# Patient Record
Sex: Female | Born: 1990 | Race: White | Hispanic: Yes | Marital: Married | Smoking: Never smoker
Health system: Southern US, Community
[De-identification: ages and names within clinical notes are randomized; demographics above are authoritative.]

## PROBLEM LIST (undated history)

## (undated) DIAGNOSIS — E119 Type 2 diabetes mellitus without complications: Secondary | ICD-10-CM

## (undated) DIAGNOSIS — Z0000 Encounter for general adult medical examination without abnormal findings: Secondary | ICD-10-CM

## (undated) HISTORY — DX: Encounter for general adult medical examination without abnormal findings: Z00.00

## (undated) HISTORY — PX: CHOLECYSTECTOMY: SHX55

## (undated) HISTORY — PX: APPENDECTOMY: SHX54

---
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
## 2014-07-01 ENCOUNTER — Emergency Department (HOSPITAL_COMMUNITY)
Admission: EM | Admit: 2014-07-01 | Discharge: 2014-07-01 | Disposition: A | Discharge status: Home / Self Care | Payer: Self-pay | Attending: Emergency Medicine | Admitting: Emergency Medicine

## 2014-07-01 ENCOUNTER — Emergency Department (HOSPITAL_COMMUNITY): Payer: Self-pay

## 2014-07-01 ENCOUNTER — Encounter (HOSPITAL_COMMUNITY): Payer: Self-pay | Admitting: Emergency Medicine

## 2014-07-01 DIAGNOSIS — N946 Dysmenorrhea, unspecified: Secondary | ICD-10-CM | POA: Insufficient documentation

## 2014-07-01 DIAGNOSIS — N390 Urinary tract infection, site not specified: Secondary | ICD-10-CM | POA: Insufficient documentation

## 2014-07-01 DIAGNOSIS — Z3202 Encounter for pregnancy test, result negative: Secondary | ICD-10-CM | POA: Insufficient documentation

## 2014-07-01 LAB — URINE MICROSCOPIC-ADD ON

## 2014-07-01 LAB — URINALYSIS, ROUTINE W REFLEX MICROSCOPIC
Bilirubin Urine: NEGATIVE
Glucose, UA: NEGATIVE mg/dL
Ketones, ur: 15 mg/dL — AB
Nitrite: NEGATIVE
Protein, ur: 30 mg/dL — AB
Specific Gravity, Urine: 1.022 (ref 1.005–1.030)
Urobilinogen, UA: 0.2 mg/dL (ref 0.0–1.0)
pH: 6 (ref 5.0–8.0)

## 2014-07-01 LAB — I-STAT CHEM 8, ED
BUN: 7 mg/dL (ref 6–23)
Calcium, Ion: 1.19 mmol/L (ref 1.12–1.23)
Chloride: 106 mEq/L (ref 96–112)
Creatinine, Ser: 0.5 mg/dL (ref 0.50–1.10)
Glucose, Bld: 110 mg/dL — ABNORMAL HIGH (ref 70–99)
HCT: 40 % (ref 36.0–46.0)
Hemoglobin: 13.6 g/dL (ref 12.0–15.0)
Potassium: 3.3 mEq/L — ABNORMAL LOW (ref 3.7–5.3)
Sodium: 140 mEq/L (ref 137–147)
TCO2: 19 mmol/L (ref 0–100)

## 2014-07-01 LAB — PREGNANCY, URINE: Preg Test, Ur: NEGATIVE

## 2014-07-01 LAB — WET PREP, GENITAL
Clue Cells Wet Prep HPF POC: NONE SEEN
Trich, Wet Prep: NONE SEEN
Yeast Wet Prep HPF POC: NONE SEEN

## 2014-07-01 MED ORDER — ACETAMINOPHEN 500 MG PO TABS
1000.0000 mg | ORAL_TABLET | Freq: Once | ORAL | Status: AC
  Administered 2014-07-01: 1000 mg via ORAL
  Filled 2014-07-01: qty 2

## 2014-07-01 MED ORDER — NAPROXEN 250 MG PO TABS: 250.0000 mg | ORAL_TABLET | Freq: Two times a day (BID) | ORAL | Status: DC | PRN

## 2014-07-01 MED ORDER — KETOROLAC TROMETHAMINE 60 MG/2ML IM SOLN
60.0000 mg | Freq: Once | INTRAMUSCULAR | Status: DC
  Filled 2014-07-01: qty 2

## 2014-07-01 MED ORDER — CEPHALEXIN 500 MG PO CAPS: 500.0000 mg | ORAL_CAPSULE | Freq: Four times a day (QID) | ORAL | Status: DC

## 2014-07-01 NOTE — Discharge Instructions (Signed)
°Emergency Department Resource Guide °1) Find a Doctor and Pay Out of Pocket °Although you won't have to find out who is covered by your insurance plan, it is a good idea to ask around and get recommendations. You will then need to call the office and see if the doctor you have chosen will accept you as a new patient and what types of options they offer for patients who are self-pay. Some doctors offer discounts or will set up payment plans for their patients who do not have insurance, but you will need to ask so you aren't surprised when you get to your appointment. ° °2) Contact Your Local Health Department °Not all health departments have doctors that can see patients for sick visits, but many do, so it is worth a call to see if yours does. If you don't know where your local health department is, you can check in your phone book. The CDC also has a tool to help you locate your state's health department, and many state websites also have listings of all of their local health departments. ° °3) Find a Walk-in Clinic °If your illness is not likely to be very severe or complicated, you may want to try a walk in clinic. These are popping up all over the country in pharmacies, drugstores, and shopping centers. They're usually staffed by nurse practitioners or physician assistants that have been trained to treat common illnesses and complaints. They're usually fairly quick and inexpensive. However, if you have serious medical issues or chronic medical problems, these are probably not your best option. ° °No Primary Care Doctor: °- Call [facility] at  832-8000 - they can help you locate a primary care doctor that  accepts your insurance, provides certain services, etc. °- Physician Referral Service- 1-800-533-3463 ° °Chronic Pain Problems: °Organization         Address  Phone   Notes  °[facility]  (336) [phone] Patients need to be referred by their primary care doctor.  ° °Medication  Assistance: °Organization         Address  Phone   Notes  °[place] [facility] [address] °[place], [place] [address] (336) 641-8030 --Must be a resident of [place] °-- Must have NO insurance coverage whatsoever (no Medicaid/ Medicare, etc.) °-- The pt. MUST have a primary care doctor that directs their care regularly and follows them in the community °  °[facility]  (866) 331-1348   °[employer]  (888) 892-1162   ° °Agencies that provide inexpensive medical care: °Organization         Address  Phone   Notes  °[facility]  (336) 832-8035   °[facility]    (336) 832-7272   °[facility] [address] °[place], [place] [address] (336) 832-4777   °[facility] [address], °[place] (336) 271-4999   °[facility]    (336) 373-0678   °[facility]    (336) 272-1050   °[facility] ° [address], [place] Phone:  (336) 832-4444, Fax:  (336) 832-4440 Hours of Operation:  9 am - 6 pm, M-F.  Also accepts Medicaid/Medicare and self-pay.  °[facility] ° [address], [place] Phone: (336) [phone], Fax: (336) [phone]. Hours of Operation:  8:30 am - 5:30 pm, M-F.  Also accepts Medicaid and self-pay.  °[facility] [place] [address], [place] Phone: (336) 878-6027   °[facility] [address], [place], [place] (336)723-1848, Ext. 123 Mondays & Thursdays: 7-9 AM.  First 15 patients are seen on a first come, first serve basis. °  ° °Medicaid-accepting [place] Providers: ° °Organization         Address  Phone   Notes  °[facility] [address], [place] (336) 641-2100 Also accepts self-pay patients.  °[facility] [address], [place] ° (336) 856-9996   °[facility] [address], [place]  (336) 288-8857   °[facility] [address] [place] [address], [place] (336) 299-7000   °Veita Bland [address], [place]  ° (336) 373-1557 Only accepts [place] Access Medicaid patients after they have their name applied to their card.  ° °Self-Pay (no insurance) in [place]: ° °Organization         Address  Phone   Notes  °Sickle Cell Patients, [facility] [address], [place] (336) 832-1970   °[facility] [address], [place] (336) 832-4400   °[facility] ° [address], [place] (336) 992-4800   °[facility]/Dr. Osei-Bonsu ° [address] [place] [address], [place] or [address], [place] (336) [phone] Phone number for both [place] and [place] locations is the same.  °[facility] [address], [place] (336) 299-0000   °[facility] [address] [place] [address], [place] or [address] (336) 852-7530 °(336) 878-2260   °[facility] [address], [place] (336) 350-1642, phone; (336) 294-5005, fax Sees patients 1st and 3rd Saturday of every month.  Must not qualify for public or private insurance (i.e. Medicaid, Medicare, [place] Health Choice, Veterans' Benefits) • Household income should be no more than 200% of the poverty level •The clinic cannot treat you if you are pregnant or think you are pregnant • Sexually transmitted diseases are not treated at the clinic.  ° ° °Dental Care: °Organization         Address  Phone  Notes  °[place] [facility] [address], [place] (336) 641-6152 Accepts children up to age 21 who are enrolled in Medicaid or [place] Health Choice; pregnant women with a Medicaid card; and children who have applied for Medicaid or [place] Health Choice, but were declined, whose parents can pay a reduced fee at time of service.  °[place]  [facility] [place]  [address], [place] (336) 641-7733 Accepts children up to age 21 who are enrolled in Medicaid or [place] Health Choice; pregnant women with a Medicaid card; and children who have applied for Medicaid or [place] Health Choice, but were declined, whose parents can pay a reduced fee at time of service.  °[facility] ° [address], [place] (336) 641-4533 Patients are seen by appointment only. Walk-ins are not accepted. [facility] will see patients 18 years of age and older. °Monday - Tuesday (8am-5pm) °Most Wednesdays (8:30-5pm) °$30 per visit, cash only  °[facility] ° [address], [place] (336) 641-4533 Patients are seen by appointment only. Walk-ins are not accepted. [facility] will see patients 18 years of age and older. °One   Wednesday Evening (Monthly: Volunteer Based).  $30 per visit, cash only  °[facility]  (919) [phone] for adults; Children under age 4, call Graduate Pediatric Dentistry at ([phone]) [phone]. Children aged 4-14, please call ([phone]) [phone] to request a pediatric application. ° Dental services are provided in all areas of dental care including fillings, crowns and bridges, complete and partial dentures, implants, gum treatment, root canals, and extractions. Preventive care is also provided. Treatment is provided to both adults and children. °Patients are selected via a lottery and there is often a waiting list. °  °[facility] [address], °[place] ° (336) 763-8833 [email or web address] °  °[facility] [address], [place], [place] (336)723-1848, Ext. 123 Second and Fourth Thursday of each month, opens at 6:30 AM; Clinic ends at 9 AM.  Patients are seen on a first-come first-served basis, and a limited number are seen during each clinic.  ° °[facility] ° [address], [place], [place] (336) 723-7904    Eligibility Requirements °You must have lived in [place], [place], or [place] counties for at least the last three months. °  You cannot be eligible for state or federal sponsored healthcare insurance, including Veterans Administration, Medicaid, or Medicare. °  You generally cannot be eligible for healthcare insurance through your employer.  °  How to apply: °Eligibility screenings are held every Tuesday and Wednesday afternoon from 1:00 pm until 4:00 pm. You do not need an appointment for the interview!  °[facility] [address], [place], [place] 336-631-2330   °[facility]  336-342-8273   °[facility]  336-703-3100   °[facility]  336-570-6415   ° °Behavioral Health Resources in the Community: °Intensive Outpatient Programs °Organization         Address  Phone  Notes  °[facility] [address], [place], [place] 336-878-6098   °[facility] [address], [place], [place] 336-832-9800   °[facility] [address], [place], [place] ° 336-882-2125   °[facility] [address],  °[place], [place] 1-800-853-5163 or 336-641-4981   °Substance Abuse Resources °Organization         Address  Phone  Notes  °[facility]  336-882-2125   °[facility]  336-784-9470   °[facility]  336-285-9073   °[facility]  336-845-3988   °Residential & Outpatient Substance Abuse Program  1-800-659-3381   °Psychological Services °Organization         Address  Phone  Notes  °[facility]  336- 832-9600   °[facility]  336- 378-7881   °[facility] [address], [place] 1-800-853-5163 or 336-641-4981   ° °Mobile Crisis Teams °Organization         Address  Phone  Notes  °[facility], Mobile Crisis Care Unit  1-877-626-1772   °Assertive °[facility] ° [address]  [place], [place] 336-834-9664   °Sharon DeEsch [address] °[place] 336-554-5454   ° °Self-Help/Support Groups °Organization         Address  Phone             Notes  °[facility] of  - variety of support groups  336- 373-1402 Call for more information  °Narcotics Anonymous (NA), [facility] [address], °[place]  2 meetings at this location  ° °  Residential Treatment Programs Organization         Address  Phone  Notes  [facility] [address],    [place]  [phone]   [facility]  [address], [place] [phone], [place], [place] [phone]   [facility] [address], [place] [phone] Admissions: 8am-3pm M-F  [facility] [address]    [place], [place] [phone]   [facility] [address], [place], [place] [phone]) [phone]   [facility] [address]  [place], [place] [phone]   [facility] Intensive Outpatient [address], [place], [place] [phone]   [facility] ([facility] [address]  [place], [place] [phone] or [phone]   [facility] ([facility]) [address] [place], [place] [phone] Accepts Medicaid  [facility] [address]  [place] [phone] Substance Abuse/Addiction Treatment   [facility] Organization         Address  Phone  Notes  [facility]  [phone](888) (740)[phone]   Angie FavaJulie Brannon, PhD [address], [address] [place], [place]   [phone](336) [phone] or [phone](336) ([phone]   [facility]   [address] [place], [place] [phone](336) [phone]   [facility] [address], [place], [place] [phone](336) [phone] Insurance/Medicaid/sponsorship through [facility]  [facility] [address]                                    [place], [place] [phone]) [phone] Therapy/tele-psych/case    [facility] [address]   [place], [place] [phone]) [phone]    Dr. Lolly MustacheArfeen  [phone]) [phone]   [facility]  [employer] [facility] 1) [address], [place] 2) [address], [place] 3)  [address], [place] [phone](336) [phone](336) [phone]  [phone](336) [phone]   [facility] Child Abuse Hotline [phone](336) 9251494717 or [phone](336) [phone] (After Hours)      Take the prescriptions as directed.  Apply moist heat or ice to the area(s) of discomfort, for 15 minutes at a time, several times per day for the next few days.  Do not fall asleep on a heating or ice pack.  Call your regular medical or OB/GYN doctor today to schedule a follow up appointment within the next week.  Return to the Emergency Department immediately if worsening.

## 2014-07-01 NOTE — ED Notes (Signed)
Per pt sts that she has been having abdominal cramping and diarrhea over the past few days. sts she is on her period.

## 2014-07-01 NOTE — ED Provider Notes (Signed)
CSN: [id number]     Arrival date & time 07/01/14  1027 History   First MD Initiated Contact with Patient 07/01/14 1043     Chief Complaint  Patient presents with  . Pelvic Pain      HPI Pt was seen at 1045.  Per pt, c/o gradual onset and persistence of constant lower pelvic "pain" for the past 3 to 4 days.  Has been associated with multiple intermittent episodes of loose stools.  Describes the pelvic pain as "cramping."  Denies N/V, no diarrhea, no fevers, no back pain, no rash, no CP/SOB, no black or blood in stools.       History reviewed. No pertinent past medical history.  Past Surgical History  Procedure Laterality Date  . Appendectomy    . Cholecystectomy      History  Substance Use Topics  . Smoking status: Never Smoker   . Smokeless tobacco: Not on file  . Alcohol Use: No    Review of Systems ROS: Statement: All systems negative except as marked or noted in the HPI; Constitutional: Negative for fever and chills. ; ; Eyes: Negative for eye pain, redness and discharge. ; ; ENMT: Negative for ear pain, hoarseness, nasal congestion, sinus pressure and sore throat. ; ; Cardiovascular: Negative for chest pain, palpitations, diaphoresis, dyspnea and peripheral edema. ; ; Respiratory: Negative for cough, wheezing and stridor. ; ; Gastrointestinal: Negative for nausea, vomiting, diarrhea, abdominal pain, blood in stool, hematemesis, jaundice and rectal bleeding. . ; ; Genitourinary: Negative for dysuria, flank pain and hematuria. ; ; GYN:  +pelvic pain, +vaginal bleeding, no vaginal discharge, no vulvar pain.;;  Musculoskeletal: Negative for back pain and neck pain. Negative for swelling and trauma.; ; Skin: Negative for pruritus, rash, abrasions, blisters, bruising and skin lesion.; ; Neuro: Negative for headache, lightheadedness and neck stiffness. Negative for weakness, altered level of consciousness , altered mental status, extremity weakness, paresthesias, involuntary movement,  seizure and syncope.      Allergies  Review of patient's allergies indicates no known allergies.  Home Medications   Prior to Admission medications   Not on File   BP 121/83  Pulse 120  Temp(Src) 98.2 F (36.8 C) (Oral)  Resp 18  Ht 5\' 3"  (1.6 m)  Wt 142 lb (64.411 kg)  BMI 25.16 kg/m2  SpO2 99%  LMP 06/29/2014 Physical Exam 1050: Physical examination:  Nursing notes reviewed; Vital signs and O2 SAT reviewed;  Constitutional: Well developed, Well nourished, Well hydrated, In no acute distress; Head:  Normocephalic, atraumatic; Eyes: EOMI, PERRL, No scleral icterus; ENMT: Mouth and pharynx normal, Mucous membranes moist; Neck: Supple, Full range of motion, No lymphadenopathy; Cardiovascular: Regular rate and rhythm, No murmur, rub, or gallop; Respiratory: Breath sounds clear & equal bilaterally, No rales, rhonchi, wheezes.  Speaking full sentences with ease, Normal respiratory effort/excursion; Chest: Nontender, Movement normal; Abdomen: Soft, +very mild suprapubic tenderness to palp. No rebound or guarding. Nondistended, Normal bowel sounds.; Genitourinary: No CVA tenderness. Pelvic exam performed with permission of pt and female ED tech assist during exam.  External genitalia w/o lesions. Vaginal vault without discharge, scant amount of dark blood in vault.  Cervix w/o lesions, not friable, GC/chlam and wet prep obtained and sent to lab.  Bimanual exam w/o CMT, uterine or adnexal tenderness.;; Extremities: Pulses normal, No tenderness, No edema, No calf edema or asymmetry.; Neuro: AA&Ox3, Major CN grossly intact.  Speech clear. No gross focal motor or sensory deficits in extremities. Climbs on and off stretcher easily  by herself. Gait steady.; Skin: Color normal, Warm, Dry.   ED Course  Procedures     MDM  MDM Reviewed: previous chart, nursing note and vitals Reviewed previous: labs Interpretation: labs and ultrasound     Results for orders placed during the hospital  encounter of 07/01/14  WET PREP, GENITAL      Result Value Ref Range   Yeast Wet Prep HPF POC NONE SEEN  NONE SEEN   Trich, Wet Prep NONE SEEN  NONE SEEN   Clue Cells Wet Prep HPF POC NONE SEEN  NONE SEEN   WBC, Wet Prep HPF POC FEW (*) NONE SEEN  URINALYSIS, ROUTINE W REFLEX MICROSCOPIC      Result Value Ref Range   Color, Urine YELLOW  YELLOW   APPearance HAZY (*) CLEAR   Specific Gravity, Urine 1.022  1.005 - 1.030   pH 6.0  5.0 - 8.0   Glucose, UA NEGATIVE  NEGATIVE mg/dL   Hgb urine dipstick LARGE (*) NEGATIVE   Bilirubin Urine NEGATIVE  NEGATIVE   Ketones, ur 15 (*) NEGATIVE mg/dL   Protein, ur 30 (*) NEGATIVE mg/dL   Urobilinogen, UA 0.2  0.0 - 1.0 mg/dL   Nitrite NEGATIVE  NEGATIVE   Leukocytes, UA SMALL (*) NEGATIVE  PREGNANCY, URINE      Result Value Ref Range   Preg Test, Ur NEGATIVE  NEGATIVE  URINE MICROSCOPIC-ADD ON      Result Value Ref Range   Squamous Epithelial / LPF MANY (*) RARE   WBC, UA 7-10  <3 WBC/hpf   RBC / HPF 7-10  <3 RBC/hpf   Bacteria, UA MANY (*) RARE   Urine-Other MUCOUS PRESENT    I-STAT CHEM 8, ED      Result Value Ref Range   Sodium 140  137 - 147 mEq/L   Potassium 3.3 (*) 3.7 - 5.3 mEq/L   Chloride 106  96 - 112 mEq/L   BUN 7  6 - 23 mg/dL   Creatinine, Ser 1.61  0.50 - 1.10 mg/dL   Glucose, Bld 096 (*) 70 - 99 mg/dL   Calcium, Ion 0.45  4.09 - 1.23 mmol/L   TCO2 19  0 - 100 mmol/L   Hemoglobin 13.6  12.0 - 15.0 g/dL   HCT 81.1  91.4 - 78.2 %   US Pelvis Complete 07/01/2014   CLINICAL DATA:  Pelvic pain suspect ovarian torsion  EXAM: TRANSABDOMINAL AND TRANSVAGINAL ULTRASOUND OF PELVIS  DOPPLER ULTRASOUND OF OVARIES  TECHNIQUE: Both transabdominal and transvaginal ultrasound examinations of the pelvis were performed. Transabdominal technique was performed for global imaging of the pelvis including uterus, ovaries, adnexal regions, and pelvic cul-de-sac.  It was necessary to proceed with endovaginal exam following the transabdominal exam  to visualize the endometrium and adnexal structures. Color and duplex Doppler ultrasound was utilized to evaluate blood flow to the ovaries.  COMPARISON:  None.  FINDINGS: Uterus  Measurements: 5.9 x 3.3 x 5.0 cm. No fibroids or other mass visualized.  Endometrium  Thickness: 4.5 mm.  No focal abnormality visualized.  Right ovary  Measurements: 3.3 x 1.8 x 1.5 cm. Normal appearance/no adnexal mass.  Left ovary  Measurements: 2.9 x 1.5 x 2.0 cm. Normal appearance/no adnexal mass.  Pulsed Doppler evaluation of both ovaries demonstrates normal low-resistance arterial and venous waveforms.  Other findings  There is a small amount of free pelvic fluid.  IMPRESSION: 1. The vascularity of the ovaries is normal. 2. The architecture of the uterus and adnexal  structures is normal.   Electronically Signed   By: David  [place]   On: 07/01/2014 12:31    1255:  Workup reassuring. Will tx for UTI. Potassium repleted PO. Pt tol PO well while in the ED without N/V. No stooling while in the ED. Pt feels better after meds and wants to go home now. Dx and testing d/w pt and family.  Questions answered.  Verb understanding, agreeable to d/c home with outpt f/u.   Laray AngerKathleen M Bond Grieshop, DO 07/04/14 [phone]

## 2014-07-02 LAB — GC/CHLAMYDIA PROBE AMP
CT Probe RNA: NEGATIVE
GC Probe RNA: NEGATIVE

## 2019-06-01 ENCOUNTER — Other Ambulatory Visit: Payer: Self-pay

## 2019-06-01 ENCOUNTER — Emergency Department (HOSPITAL_COMMUNITY): Payer: Self-pay

## 2019-06-01 ENCOUNTER — Emergency Department (HOSPITAL_COMMUNITY)
Admission: EM | Admit: 2019-06-01 | Discharge: 2019-06-01 | Disposition: A | Discharge status: Home / Self Care | Payer: Self-pay | Attending: Emergency Medicine | Admitting: Emergency Medicine

## 2019-06-01 ENCOUNTER — Encounter (HOSPITAL_COMMUNITY): Payer: Self-pay | Admitting: Emergency Medicine

## 2019-06-01 DIAGNOSIS — Z79899 Other long term (current) drug therapy: Secondary | ICD-10-CM | POA: Insufficient documentation

## 2019-06-01 DIAGNOSIS — R0789 Other chest pain: Secondary | ICD-10-CM | POA: Insufficient documentation

## 2019-06-01 DIAGNOSIS — R079 Chest pain, unspecified: Secondary | ICD-10-CM

## 2019-06-01 DIAGNOSIS — R202 Paresthesia of skin: Secondary | ICD-10-CM | POA: Insufficient documentation

## 2019-06-01 LAB — BASIC METABOLIC PANEL
Anion gap: 11 (ref 5–15)
BUN: 11 mg/dL (ref 6–20)
CO2: 22 mmol/L (ref 22–32)
Calcium: 9.1 mg/dL (ref 8.9–10.3)
Chloride: 105 mmol/L (ref 98–111)
Creatinine, Ser: 0.62 mg/dL (ref 0.44–1.00)
GFR calc Af Amer: >60 mL/min (ref >60)
GFR calc non Af Amer: >60 mL/min (ref >60)
Glucose, Bld: 109 mg/dL — ABNORMAL HIGH (ref 70–99)
Potassium: 3 mmol/L — ABNORMAL LOW (ref 3.5–5.1)
Sodium: 138 mmol/L (ref 135–145)

## 2019-06-01 LAB — I-STAT BETA HCG BLOOD, ED (MC, WL, AP ONLY): I-stat hCG, quantitative: <5 m[IU]/mL (ref <5)

## 2019-06-01 LAB — CBC
HCT: 39.6 % (ref 36.0–46.0)
Hemoglobin: 13.9 g/dL (ref 12.0–15.0)
MCH: 31 pg (ref 26.0–34.0)
MCHC: 35.1 g/dL (ref 30.0–36.0)
MCV: 88.4 fL (ref 80.0–100.0)
Platelets: 244 10*3/uL (ref 150–400)
RBC: 4.48 MIL/uL (ref 3.87–5.11)
RDW: 12.5 % (ref 11.5–15.5)
WBC: 8.4 10*3/uL (ref 4.0–10.5)
nRBC: 0 % (ref 0.0–0.2)

## 2019-06-01 LAB — TROPONIN I: Troponin I: <0.03 ng/mL (ref <0.03)

## 2019-06-01 LAB — D-DIMER, QUANTITATIVE: D-Dimer, Quant: <0.27 ug/mL-FEU (ref 0.00–0.50)

## 2019-06-01 MED ORDER — SODIUM CHLORIDE 0.9% FLUSH: 3.0000 mL | Freq: Once | INTRAVENOUS | Status: DC

## 2019-06-01 MED ORDER — SODIUM CHLORIDE 0.9 % IV BOLUS
1000.0000 mL | Freq: Once | INTRAVENOUS | Status: AC
  Administered 2019-06-01: 1000 mL via INTRAVENOUS

## 2019-06-01 NOTE — ED Triage Notes (Signed)
Patient reports left sided chest pressure that radiates into the left arm and back.  Describes arm as tingling.  Reports working for the first time in years and has been having shoulder pain prior to the start of the chest pain.

## 2019-06-01 NOTE — ED Provider Notes (Signed)
ED ECG REPORT   Date: 06/01/2019 0029am  Rate: 131  Rhythm: sinus tachycardia  QRS Axis: normal  Intervals: normal  ST/T Wave abnormalities: nonspecific ST changes  Conduction Disutrbances:none  Narrative Interpretation:   Old EKG Reviewed: none available  I have personally reviewed the EKG tracing and agree with the computerized printout as noted.    Ripley Fraise, MD 06/01/19 [phone]

## 2019-06-01 NOTE — ED Provider Notes (Signed)
[facility] EMERGENCY DEPARTMENT Provider Note   CSN: [id number] Arrival date & time: 06/01/19  0024    History   Chief Complaint Chief Complaint  Patient presents with  . Chest Pain    HPI Christy Cook is a 28 y.o. female with no significant past medical history who presents today for evaluation of left-sided chest pressure that radiates into her left arm and back.  She reports that she has had this pain since Wednesday (17th).  She also has tingling in her left arm.  She denies any nausea, or vomiting.  She is not been coughing or short of breath.  She denies any fevers or known coronavirus contacts.  She denies any trauma.  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  She tried a dose of ibuprofen yesterday which provided moderate temporary relief.  When her pain happens it lasts approximately 5 seconds then goes away.  She has a Nexplanon birth control implant in her arm.    HPI  History reviewed. No pertinent past medical history.  There are no active problems to display for this patient.   Past Surgical History:  Procedure Laterality Date  . APPENDECTOMY    . CHOLECYSTECTOMY       OB History   No obstetric history on file.      Home Medications    Prior to Admission medications   Medication Sig Start Date End Date Taking? Authorizing Provider  etonogestrel (NEXPLANON) 68 MG IMPL implant 1 each by Subdermal route once.   Yes [provider]  cephALEXin (KEFLEX) 500 MG capsule Take 1 capsule (500 mg total) by mouth 4 (four) times daily. Patient not taking: Reported on 06/01/2019 07/01/14   Francine Graven, DO  naproxen (NAPROSYN) 250 MG tablet Take 1 tablet (250 mg total) by mouth 2 (two) times daily as needed for mild pain or moderate pain (Take with meals). Patient not taking: Reported on  06/01/2019 07/01/14   Francine Graven, DO    Family History No family history on file.  Social History Social History   Tobacco Use  . Smoking status: Never Smoker  . Smokeless tobacco: Never Used  Substance Use Topics  . Alcohol use: No  . Drug use: Not on file     Allergies   Patient has no known allergies.   Review of Systems Review of Systems  Constitutional: Negative for chills and fever.  Respiratory: Negative for cough, chest tightness, shortness of breath and stridor.   Cardiovascular: Positive for chest pain. Negative for palpitations and leg swelling.  Gastrointestinal: Negative for abdominal pain, diarrhea, nausea and vomiting.  Musculoskeletal: Positive for back pain.  Neurological: Negative for weakness and headaches.  All other systems reviewed and are negative.    Physical Exam Updated Vital Signs BP 105/80   Pulse 100   Temp 99.1 F (37.3 C) (Oral)   Resp 14   Ht 5\' 3"  (1.6 m)   Wt 64.4 kg   LMP 04/01/2019 (Approximate)   SpO2 98%   BMI 25.15 kg/m   Physical Exam Vitals signs and nursing note reviewed.  Constitutional:      General: She is not in acute distress.    Appearance: She is well-developed. She is not diaphoretic.  HENT:     Head: Normocephalic and atraumatic.  Eyes:     General: No scleral icterus.  Right eye: No discharge.        Left eye: No discharge.     Conjunctiva/sclera: Conjunctivae normal.  Neck:     Musculoskeletal: Normal range of motion.  Cardiovascular:     Rate and Rhythm: Regular rhythm. Tachycardia present.     Pulses:          Radial pulses are 2+ on the right side and 2+ on the left side.       Dorsalis pedis pulses are 2+ on the right side and 2+ on the left side.       Posterior tibial pulses are 2+ on the right side and 2+ on the left side.     Heart sounds: Normal heart sounds.  Pulmonary:     Effort: Pulmonary effort is normal. No accessory muscle usage or respiratory distress.     Breath  sounds: Normal breath sounds. No stridor. No decreased breath sounds.  Chest:     Chest wall: No deformity or tenderness.     Comments: Unable to recreate or exacerbate her chest pain and back pain with palpation, movement of left arm.  Abdominal:     General: There is no distension.  Musculoskeletal: Normal range of motion.        General: No deformity.     Right lower leg: She exhibits no tenderness. No edema.     Left lower leg: She exhibits no tenderness. No edema.  Skin:    General: Skin is warm and dry.  Neurological:     General: No focal deficit present.     Mental Status: She is alert. She is disoriented.     Motor: No abnormal muscle tone.     Comments: Sensation intact to light touch in bilateral upper extremities.   Psychiatric:        Mood and Affect: Mood normal.        Behavior: Behavior normal.      ED Treatments / Results  Labs (all labs ordered are listed, but only abnormal results are displayed) Labs Reviewed  BASIC METABOLIC PANEL - Abnormal; Notable for the following components:      Result Value   Potassium 3.0 (*)    Glucose, Bld 109 (*)    All other components within normal limits  CBC  TROPONIN I  D-DIMER, QUANTITATIVE (NOT AT [facility])  I-STAT BETA HCG BLOOD, ED ([facility], [facility], [facility] ONLY)    EKG EKG Interpretation  Date/Time:  Friday June 01 2019 03:41:59 EDT Ventricular Rate:  79 PR Interval:    QRS Duration: 91 QT Interval:  366 QTC Calculation: 420 R Axis:   81 Text Interpretation:  Sinus rhythm RSR' in V1 or V2, right VCD or RVH HR improved on this EKG Confirmed by Zadie RhineWickline, Donald ([id number]) on 06/01/2019 3:44:38 AM   Radiology Dg Chest 2 View  Result Date: 06/01/2019 CLINICAL DATA:  28 y/o F; left-sided anterior and posterior chest pain with tingling down the left arm. EXAM: CHEST - 2 VIEW COMPARISON:  None. FINDINGS: The heart size and mediastinal contours are within normal limits. Both lungs are clear. The visualized skeletal structures are  unremarkable. IMPRESSION: No active cardiopulmonary disease. Electronically Signed   By: Mitzi HansenLance  Furusawa-Stratton M.D.   On: 06/01/2019 01:21    Procedures Procedures (including critical care time)  Medications Ordered in ED Medications  sodium chloride flush (NS) 0.9 % injection 3 mL (has no administration in time range)  sodium chloride 0.9 % bolus 1,000 mL (0 mLs Intravenous Stopped  06/01/19 0425)     Initial Impression / Assessment and Plan / ED Course  I have reviewed the triage vital signs and the nursing notes.  Pertinent labs & imaging results that were available during my care of the patient were reviewed by me and considered in my medical decision making (see chart for details).       Patient presents today for evaluation of left-sided chest and back pain for 2 days.  Unable to re-create or exacerbate her pain with palpation and arm movement.  EKG obtained showing sinus tachycardia.  Unable to apply PERC criteria as she is both tachycardic and has Nexplanon implant.    Labs were obtained and reviewed, she has a slightly low potassium at 3, declined PO replacement in the department.  Recommended increase high potassium foods.  Troponin and d-dimer were both not elevated.  Her pregnancy test is negative.  Chest x-ray without evidence of consolidation or other acute abnormalities.  She was treated with 1 L of IV fluids in the department after which her heart rate normalized and was in the 80s while I was in the room.  I suspect that her pain may be musculoskeletal in nature as it started after she had a change in tasks at her job requiring her to pull objects towards her.  Heart score is low risk.  Return precautions were discussed with patient who states their understanding.  At the time of discharge patient denied any unaddressed complaints or concerns.  Patient is agreeable for discharge home.   Final Clinical Impressions(s) / ED Diagnoses   Final diagnoses:  Chest pain,  unspecified type    ED Discharge Orders    None       Cristina GongHammond, Ercil Cassis W, Cordelia Poche-C 06/01/19 [id number]    Zadie RhineWickline, Donald, MD 06/01/19 [phone]

## 2019-06-27 ENCOUNTER — Other Ambulatory Visit: Payer: Self-pay

## 2019-06-27 DIAGNOSIS — Z20822 Contact with and (suspected) exposure to covid-19: Secondary | ICD-10-CM

## 2019-07-01 LAB — NOVEL CORONAVIRUS, NAA: SARS-CoV-2, NAA: NOT DETECTED

## 2019-07-06 ENCOUNTER — Telehealth: Payer: Self-pay | Admitting: General Practice

## 2019-07-06 NOTE — Telephone Encounter (Signed)
Informed pt of negative covid result   °

## 2019-08-20 ENCOUNTER — Emergency Department (HOSPITAL_COMMUNITY): Payer: Self-pay

## 2019-08-20 ENCOUNTER — Other Ambulatory Visit: Payer: Self-pay

## 2019-08-20 ENCOUNTER — Emergency Department (HOSPITAL_COMMUNITY)
Admission: EM | Admit: 2019-08-20 | Discharge: 2019-08-20 | Disposition: A | Discharge status: Home / Self Care | Payer: Self-pay | Attending: Emergency Medicine | Admitting: Emergency Medicine

## 2019-08-20 DIAGNOSIS — Z79899 Other long term (current) drug therapy: Secondary | ICD-10-CM | POA: Insufficient documentation

## 2019-08-20 DIAGNOSIS — R1030 Lower abdominal pain, unspecified: Secondary | ICD-10-CM

## 2019-08-20 DIAGNOSIS — N83202 Unspecified ovarian cyst, left side: Secondary | ICD-10-CM | POA: Insufficient documentation

## 2019-08-20 DIAGNOSIS — Z20828 Contact with and (suspected) exposure to other viral communicable diseases: Secondary | ICD-10-CM | POA: Insufficient documentation

## 2019-08-20 LAB — COMPREHENSIVE METABOLIC PANEL
ALT: 25 U/L (ref 0–44)
AST: 19 U/L (ref 15–41)
Albumin: 4.1 g/dL (ref 3.5–5.0)
Alkaline Phosphatase: 41 U/L (ref 38–126)
Anion gap: 9 (ref 5–15)
BUN: 7 mg/dL (ref 6–20)
CO2: 22 mmol/L (ref 22–32)
Calcium: 8.8 mg/dL — ABNORMAL LOW (ref 8.9–10.3)
Chloride: 109 mmol/L (ref 98–111)
Creatinine, Ser: 0.58 mg/dL (ref 0.44–1.00)
GFR calc Af Amer: >60 mL/min (ref >60)
GFR calc non Af Amer: >60 mL/min (ref >60)
Glucose, Bld: 113 mg/dL — ABNORMAL HIGH (ref 70–99)
Potassium: 3.5 mmol/L (ref 3.5–5.1)
Sodium: 140 mmol/L (ref 135–145)
Total Bilirubin: 0.7 mg/dL (ref 0.3–1.2)
Total Protein: 6.9 g/dL (ref 6.5–8.1)

## 2019-08-20 LAB — CBC
HCT: 37.7 % (ref 36.0–46.0)
Hemoglobin: 13.1 g/dL (ref 12.0–15.0)
MCH: 31 pg (ref 26.0–34.0)
MCHC: 34.7 g/dL (ref 30.0–36.0)
MCV: 89.1 fL (ref 80.0–100.0)
Platelets: 236 10*3/uL (ref 150–400)
RBC: 4.23 MIL/uL (ref 3.87–5.11)
RDW: 12.4 % (ref 11.5–15.5)
WBC: 6.8 10*3/uL (ref 4.0–10.5)
nRBC: 0 % (ref 0.0–0.2)

## 2019-08-20 LAB — I-STAT BETA HCG BLOOD, ED (MC, WL, AP ONLY): I-stat hCG, quantitative: <5 m[IU]/mL (ref <5)

## 2019-08-20 LAB — URINALYSIS, ROUTINE W REFLEX MICROSCOPIC
Bilirubin Urine: NEGATIVE
Glucose, UA: NEGATIVE mg/dL
Hgb urine dipstick: NEGATIVE
Ketones, ur: 80 mg/dL — AB
Leukocytes,Ua: NEGATIVE
Nitrite: NEGATIVE
Protein, ur: NEGATIVE mg/dL
Specific Gravity, Urine: 1.023 (ref 1.005–1.030)
pH: 5 (ref 5.0–8.0)

## 2019-08-20 LAB — WET PREP, GENITAL
Clue Cells Wet Prep HPF POC: NONE SEEN
Sperm: NONE SEEN
Trich, Wet Prep: NONE SEEN
Yeast Wet Prep HPF POC: NONE SEEN

## 2019-08-20 LAB — LIPASE, BLOOD: Lipase: 24 U/L (ref 11–51)

## 2019-08-20 MED ORDER — KETOROLAC TROMETHAMINE 30 MG/ML IJ SOLN
30.0000 mg | Freq: Once | INTRAMUSCULAR | Status: AC
  Administered 2019-08-20: 30 mg via INTRAVENOUS
  Filled 2019-08-20: qty 1

## 2019-08-20 MED ORDER — FENTANYL CITRATE (PF) 100 MCG/2ML IJ SOLN
50.0000 ug | Freq: Once | INTRAMUSCULAR | Status: AC
  Administered 2019-08-20: 50 ug via INTRAVENOUS
  Filled 2019-08-20: qty 2

## 2019-08-20 MED ORDER — SODIUM CHLORIDE 0.9 % IV BOLUS
1000.0000 mL | Freq: Once | INTRAVENOUS | Status: AC
  Administered 2019-08-20: 1000 mL via INTRAVENOUS

## 2019-08-20 MED ORDER — NAPROXEN 500 MG PO TABS: 500.0000 mg | ORAL_TABLET | Freq: Two times a day (BID) | ORAL | 0 refills | Status: DC

## 2019-08-20 MED ORDER — ONDANSETRON HCL 4 MG/2ML IJ SOLN
4.0000 mg | Freq: Once | INTRAMUSCULAR | Status: AC
  Administered 2019-08-20: 4 mg via INTRAVENOUS
  Filled 2019-08-20: qty 2

## 2019-08-20 MED ORDER — METHOCARBAMOL 500 MG PO TABS: 500.0000 mg | ORAL_TABLET | Freq: Two times a day (BID) | ORAL | 0 refills | Status: DC

## 2019-08-20 NOTE — ED Provider Notes (Signed)
[facility] Provider Note   CSN: [id number] Arrival date & time: 08/20/19  1613     History   Chief Complaint Chief Complaint  Patient presents with   Abdominal Pain   Generalized Body Aches   Headache    HPI Christy Cook is a 28 y.o. female who presents for evaluation of 1 week of lower abdominal pain, generalized body aches.  She states that her abdominal pain has become more severe.  She states it feels like cramps and starts in her back and radiates to the front.  She denies any alleviating or aggravating factors.  She reports she has been able to eat despite this pain.  She denies any nausea/vomiting/diarrhea.  She has not had any fevers.  Her last bowel movement was earlier this morning and was normal.  No blood noted.  She also reports that she has felt some generalized weakness and body aches.  She states she just feels "sore" all over.  She states she will occasionally have some tingling in station in her bilateral fingers.  She denies any numbness/weakness of her arms or legs.  She denies any chest pain, difficulty breathing, cough, dysuria, hematuria, vaginal bleeding, vaginal discharge.  Her LMP was about 2 weeks ago.  She denies any recent travel or known COVID-19 exposure.  She reports that she has had her gallbladder removed and her appendix removed.     The history is provided by the patient.    No past medical history on file.  There are no active problems to display for this patient.   Past Surgical History:  Procedure Laterality Date   APPENDECTOMY     CHOLECYSTECTOMY       OB History   No obstetric history on file.      Home Medications    Prior to Admission medications   Medication Sig Start Date End Date Taking? Authorizing Provider  cephALEXin (KEFLEX) 500 MG capsule Take 1 capsule (500 mg total) by mouth 4 (four) times daily. Patient not taking: Reported on 06/01/2019 07/01/14   Samuel JesterMcManus,  Kathleen, DO  etonogestrel (NEXPLANON) 68 MG IMPL implant 1 each by Subdermal route once.    [provider]  methocarbamol (ROBAXIN) 500 MG tablet Take 1 tablet (500 mg total) by mouth 2 (two) times daily. 08/20/19   [facility], Chene Kasinger A, PA-C  naproxen (NAPROSYN) 500 MG tablet Take 1 tablet (500 mg total) by mouth 2 (two) times daily. 08/20/19   [facility], Klyn Kroening A, PA-C    Family History No family history on file.  Social History Social History   Tobacco Use   Smoking status: Never Smoker   Smokeless tobacco: Never Used  Substance Use Topics   Alcohol use: No   Drug use: Not on file     Allergies   Patient has no known allergies.   Review of Systems Review of Systems  Constitutional: Negative for fever.  Respiratory: Negative for cough and shortness of breath.   Cardiovascular: Negative for chest pain.  Gastrointestinal: Positive for abdominal pain. Negative for nausea and vomiting.  Genitourinary: Negative for dysuria and hematuria.  Musculoskeletal: Positive for myalgias.  Neurological: Negative for weakness, numbness and headaches.  All other systems reviewed and are negative.    Physical Exam Updated Vital Signs BP 110/72 (BP Location: Right Arm)    Pulse 72    Temp 98.6 F (37 C)    Resp 16    Ht 5\' 3"  (1.6 m)  Wt 74.8 kg    LMP 08/09/2019 (Exact Date)    SpO2 98%    BMI 29.23 kg/m   Physical Exam Vitals signs and nursing note reviewed. Exam conducted with a chaperone present.  Constitutional:      Appearance: Normal appearance. She is well-developed.     Comments: Sitting comfortably on examination table  HENT:     Head: Normocephalic and atraumatic.  Eyes:     General: Lids are normal.     Conjunctiva/sclera: Conjunctivae normal.     Pupils: Pupils are equal, round, and reactive to light.     Comments: PERRL. EOMs intact. No nystagmus. No neglect.   Neck:     Musculoskeletal: Full passive range of motion without pain and neck supple.      Comments: Neck is supple and without rigidity.  Cardiovascular:     Rate and Rhythm: Normal rate and regular rhythm.     Pulses: Normal pulses.     Heart sounds: Normal heart sounds. No murmur. No friction rub. No gallop.   Pulmonary:     Effort: Pulmonary effort is normal.     Breath sounds: Normal breath sounds.     Comments: Lungs clear to auscultation bilaterally.  Symmetric chest rise.  No wheezing, rales, rhonchi. Abdominal:     Palpations: Abdomen is soft. Abdomen is not rigid.     Tenderness: There is abdominal tenderness in the right lower quadrant, suprapubic area and left lower quadrant. There is right CVA tenderness and left CVA tenderness. There is no guarding.     Comments: Abdomen is soft, nondistended.  Diffuse tenderness noted to the lower abdomen.  No focal point.  No tenderness in McBurney's point.  Genitourinary:    Vagina: Vaginal discharge present.     Cervix: No cervical motion tenderness.     Adnexa:        Right: No mass or tenderness.         Left: No mass or tenderness.       Comments: The exam was performed with a chaperone present. Normal external female genitalia. No lesions, rash, or sores. White discharge noted in the vaginal vault. No CMT. No cervical friability. No adnexal mass or tenderness.  Musculoskeletal: Normal range of motion.  Skin:    General: Skin is warm and dry.     Capillary Refill: Capillary refill takes less than 2 seconds.  Neurological:     Mental Status: She is alert and oriented to person, place, and time.     Comments: Cranial nerves III-XII intact Follows commands, Moves all extremities  5/5 strength to BUE and BLE  Sensation intact throughout all major nerve distributions Normal coordination No gait abnormalities  No slurred speech. No facial droop.   Psychiatric:        Speech: Speech normal.      ED Treatments / Results  Labs (all labs ordered are listed, but only abnormal results are displayed) Labs Reviewed  WET  PREP, GENITAL - Abnormal; Notable for the following components:      Result Value   WBC, Wet Prep HPF POC MANY (*)    All other components within normal limits  COMPREHENSIVE METABOLIC PANEL - Abnormal; Notable for the following components:   Glucose, Bld 113 (*)    Calcium 8.8 (*)    All other components within normal limits  URINALYSIS, ROUTINE W REFLEX MICROSCOPIC - Abnormal; Notable for the following components:   APPearance HAZY (*)    Ketones, ur 80 (*)  All other components within normal limits  SARS CORONAVIRUS 2 (TAT 6-24 HRS)  LIPASE, BLOOD  CBC  I-STAT BETA HCG BLOOD, ED ([facility], [facility], [facility] ONLY)  GC/CHLAMYDIA PROBE AMP ([place]) NOT AT [facility]    EKG None  Radiology US Transvaginal Non-ob  Result Date: 08/20/2019 CLINICAL DATA:  Pelvic pain.  Negative beta HCG. EXAM: TRANSABDOMINAL AND TRANSVAGINAL ULTRASOUND OF PELVIS DOPPLER ULTRASOUND OF OVARIES TECHNIQUE: Both transabdominal and transvaginal ultrasound examinations of the pelvis were performed. Transabdominal technique was performed for global imaging of the pelvis including uterus, ovaries, adnexal regions, and pelvic cul-de-sac. It was necessary to proceed with endovaginal exam following the transabdominal exam to visualize the ovaries and better visualize the uterus and endometrium. Color and duplex Doppler ultrasound was utilized to evaluate blood flow to the ovaries. COMPARISON:  07/01/2014 FINDINGS: Uterus Measurements: 7.0 x 5.2 x 3.8 cm = volume: 72 mL. No fibroids or other mass visualized. Endometrium Thickness: 7.7 mm.  No focal abnormality visualized. Right ovary Measurements: 2.8 x 1.8 x 1.6 cm = volume: 4.2 mL. Normal appearance/no adnexal mass. Left ovary Measurements: 3.6 x 2.8 x 2.5 cm = volume: 13.2 mL. 2.7 cm corpus luteum cyst. Otherwise, unremarkable. Pulsed Doppler evaluation of both ovaries demonstrates normal low-resistance arterial and venous waveforms. Other findings No abnormal free fluid. IMPRESSION: No  significant abnormality. A 2.7 cm left ovarian corpus luteum cyst is noted. Electronically Signed   By: Claudie Revering M.D.   On: 08/20/2019 19:57   US Pelvis Complete  Result Date: 08/20/2019 CLINICAL DATA:  Pelvic pain.  Negative beta HCG. EXAM: TRANSABDOMINAL AND TRANSVAGINAL ULTRASOUND OF PELVIS DOPPLER ULTRASOUND OF OVARIES TECHNIQUE: Both transabdominal and transvaginal ultrasound examinations of the pelvis were performed. Transabdominal technique was performed for global imaging of the pelvis including uterus, ovaries, adnexal regions, and pelvic cul-de-sac. It was necessary to proceed with endovaginal exam following the transabdominal exam to visualize the ovaries and better visualize the uterus and endometrium. Color and duplex Doppler ultrasound was utilized to evaluate blood flow to the ovaries. COMPARISON:  07/01/2014 FINDINGS: Uterus Measurements: 7.0 x 5.2 x 3.8 cm = volume: 72 mL. No fibroids or other mass visualized. Endometrium Thickness: 7.7 mm.  No focal abnormality visualized. Right ovary Measurements: 2.8 x 1.8 x 1.6 cm = volume: 4.2 mL. Normal appearance/no adnexal mass. Left ovary Measurements: 3.6 x 2.8 x 2.5 cm = volume: 13.2 mL. 2.7 cm corpus luteum cyst. Otherwise, unremarkable. Pulsed Doppler evaluation of both ovaries demonstrates normal low-resistance arterial and venous waveforms. Other findings No abnormal free fluid. IMPRESSION: No significant abnormality. A 2.7 cm left ovarian corpus luteum cyst is noted. Electronically Signed   By: Claudie Revering M.D.   On: 08/20/2019 19:57   [place] Art/ven Flow Abd Pelv Doppler  Result Date: 08/20/2019 CLINICAL DATA:  Pelvic pain.  Negative beta HCG. EXAM: TRANSABDOMINAL AND TRANSVAGINAL ULTRASOUND OF PELVIS DOPPLER ULTRASOUND OF OVARIES TECHNIQUE: Both transabdominal and transvaginal ultrasound examinations of the pelvis were performed. Transabdominal technique was performed for global imaging of the pelvis including uterus, ovaries, adnexal  regions, and pelvic cul-de-sac. It was necessary to proceed with endovaginal exam following the transabdominal exam to visualize the ovaries and better visualize the uterus and endometrium. Color and duplex Doppler ultrasound was utilized to evaluate blood flow to the ovaries. COMPARISON:  07/01/2014 FINDINGS: Uterus Measurements: 7.0 x 5.2 x 3.8 cm = volume: 72 mL. No fibroids or other mass visualized. Endometrium Thickness: 7.7 mm.  No focal abnormality visualized. Right ovary Measurements: 2.8  x 1.8 x 1.6 cm = volume: 4.2 mL. Normal appearance/no adnexal mass. Left ovary Measurements: 3.6 x 2.8 x 2.5 cm = volume: 13.2 mL. 2.7 cm corpus luteum cyst. Otherwise, unremarkable. Pulsed Doppler evaluation of both ovaries demonstrates normal low-resistance arterial and venous waveforms. Other findings No abnormal free fluid. IMPRESSION: No significant abnormality. A 2.7 cm left ovarian corpus luteum cyst is noted. Electronically Signed   By: Christy SaltsSteven  Cook M.D.   On: 08/20/2019 19:57    Procedures Procedures (including critical care time)  Medications Ordered in ED Medications  sodium chloride 0.9 % bolus 1,000 mL (0 mLs Intravenous Stopped 08/20/19 1910)  ketorolac (TORADOL) 30 MG/ML injection 30 mg (30 mg Intravenous Given 08/20/19 1803)  fentaNYL (SUBLIMAZE) injection 50 mcg (50 mcg Intravenous Given 08/20/19 1953)  ondansetron (ZOFRAN) injection 4 mg (4 mg Intravenous Given 08/20/19 1953)     Initial Impression / Assessment and Plan / ED Course  I have reviewed the triage vital signs and the nursing notes.  Pertinent labs & imaging results that were available during my care of the patient were reviewed by me and considered in my medical decision making (see chart for details).        28 year old female who presents for evaluation of lower abdominal pain x1 week.  No associated fevers, nausea/vomiting, chest pain, difficulty breathing.  Initial ED arrival, she is afebrile, nontoxic appearing, sitting  comfortably in bed with no signs of acute distress.  She is slightly tachycardic.  Vitals otherwise stable.  She has tenderness in lower abdomen diffusely with no focal point.  She also has some bilateral CVA tenderness.  Consider infectious etiology versus GU etiology versus ovarian cyst.  History/physical exam not concerning for ovarian torsion, appendicitis. History/physical exam not ocncerning for perforation, obstruction.  Also consider COVID given consolation of symptoms.  I-STAT beta is negative.  Lipase is unremarkable.  CBC without any significant leukocytosis or anemia.  CMP shows normal gait and creatinine. UA shows no hgb, nitrites, leukocytes.   Pelvic exam as documented above.  No CMT that would be concerning for PID.  Not adnexal mass or tenderness noted bilaterally.  She did have some thick white discharge in the vaginal vault.  She reports her pain is 5/10 after pain medications.  She still has some mild lower abdominal tenderness.  Will plan for ultrasound of pelvis for evaluation.  Ultrasound shows 2.7 cm left ovarian cyst which could be contributing to symptoms.  No evidence of torsion.  No other acute abnormalities.  Reevaluation after analgesics.  Patient reports pain has significantly improved.  Repeat abdominal exam with no significant tenderness.  She states she still feels somewhat achy, most particularly in her back.  Urine is not concerning for pyelonephritis.  Additionally, she has no hemoglobin in her urine and her symptoms have been ongoing for 1 week without any urinary complaints, nausea/vomiting.  I do not suspect kidney stone as a source of her symptoms.  I discussed extensively with patient and engaged in shared decision making.  I did offer her further evaluation with a CT scan here in the ED the patient declined.  We will plan to treat with Naprosyn and Robaxin.  Patient given outpatient OB/GYN referral.  Given generalized body aches, abdominal pain, headaches, will plan  to test for COVID-19. At this time, patient exhibits no emergent life-threatening condition that require further evaluation in ED or admission. Patient had ample opportunity for questions and discussion. All patient's questions were answered with full understanding.  Strict return precautions discussed. Patient expresses understanding and agreement to plan.    Dian Laprade was evaluated in Emergency Department on 08/20/2019 for the symptoms described in the history of present illness. She was evaluated in the context of the global COVID-19 pandemic, which necessitated consideration that the patient might be at risk for infection with the SARS-CoV-2 virus that causes COVID-19. Institutional protocols and algorithms that pertain to the evaluation of patients at risk for COVID-19 are in a state of rapid change based on information released by regulatory bodies including the CDC and federal and state organizations. These policies and algorithms were followed during the patient's care in the ED.   Portions of this note were generated with Scientist, clinical (histocompatibility and immunogenetics). Dictation errors may occur despite best attempts at proofreading.   Final Clinical Impressions(s) / ED Diagnoses   Final diagnoses:  Cyst of left ovary  Lower abdominal pain    ED Discharge Orders         Ordered    naproxen (NAPROSYN) 500 MG tablet  2 times daily     08/20/19 2109    methocarbamol (ROBAXIN) 500 MG tablet  2 times daily     08/20/19 2109           Christy Caul, PA-C 08/20/19 2245    Terrilee Files, MD 08/20/19 2322

## 2019-08-20 NOTE — ED Triage Notes (Signed)
Pt here for evaluation of headache, lower back pain, lower abdominal pain, and diarrhea x 1 week. Pt endorses generalized weakness. Denies sick contacts.

## 2019-08-20 NOTE — Discharge Instructions (Addendum)
Take naproxen as directed.  Take Robaxin as prescribed. This medication will make you drowsy so do not drive or drink alcohol when taking it.  He should not breast-feed while taking these medications.  As we discussed, we have provided with outpatient referral to OB/GYN.  Please follow-up with them.  Return to the Emergency Department immediately if you experience any worsening abdominal pain, fever, persistent nausea and vomiting, inability keep any food down, pain with urination, blood in your urine or any other worsening or concerning symptoms.  You have been tested for COVID today.  You should quarantine until the results come back.  They will take about 24 to 48 hours to come back.     Person Under Monitoring Name: Christy Cook  Location: [address]  [place] [id number]   Infection Prevention Recommendations for Individuals Confirmed to have, or Being Evaluated for, 2019 Novel Coronavirus (COVID-19) Infection Who Receive Care at Home  Individuals who are confirmed to have, or are being evaluated for, COVID-19 should follow the prevention steps below until a healthcare provider or local or state health department says they can return to normal activities.  Stay home except to get medical care You should restrict activities outside your home, except for getting medical care. Do not go to work, school, or public areas, and do not use public transportation or taxis.  Call ahead before visiting your doctor Before your medical appointment, call the healthcare provider and tell them that you have, or are being evaluated for, COVID-19 infection. This will help the healthcare providers office take steps to keep other people from getting infected. Ask your healthcare provider to call the local or state health department.  Monitor your symptoms Seek prompt medical attention if your illness is worsening (e.g., difficulty breathing). Before going to your  medical appointment, call the healthcare provider and tell them that you have, or are being evaluated for, COVID-19 infection. Ask your healthcare provider to call the local or state health department.  Wear a facemask You should wear a facemask that covers your nose and mouth when you are in the same room with other people and when you visit a healthcare provider. People who live with or visit you should also wear a facemask while they are in the same room with you.  Separate yourself from other people in your home As much as possible, you should stay in a different room from other people in your home. Also, you should use a separate bathroom, if available.  Avoid sharing household items You should not share dishes, drinking glasses, cups, eating utensils, towels, bedding, or other items with other people in your home. After using these items, you should wash them thoroughly with soap and water.  Cover your coughs and sneezes Cover your mouth and nose with a tissue when you cough or sneeze, or you can cough or sneeze into your sleeve. Throw used tissues in a lined trash can, and immediately wash your hands with soap and water for at least 20 seconds or use an alcohol-based hand rub.  Wash your [employer]hands Wash your hands often and thoroughly with soap and water for at least 20 seconds. You can use an alcohol-based hand sanitizer if soap and water are not available and if your hands are not visibly dirty. Avoid touching your eyes, nose, and mouth with unwashed hands.   Prevention Steps for Caregivers and Household Members of Individuals Confirmed to have, or Being Evaluated for, COVID-19 Infection Being Cared for in  the Home  If you live with, or provide care at home for, a person confirmed to have, or being evaluated for, COVID-19 infection please follow these guidelines to prevent infection:  Follow healthcare providers instructions Make sure that you understand and can help the  patient follow any healthcare provider instructions for all care.  Provide for the patients basic needs You should help the patient with basic needs in the home and provide support for getting groceries, prescriptions, and other personal needs.  Monitor the patients symptoms If they are getting sicker, call his or her medical provider and tell them that the patient has, or is being evaluated for, COVID-19 infection. This will help the healthcare providers office take steps to keep other people from getting infected. Ask the healthcare provider to call the local or state health department.  Limit the number of people who have contact with the patient If possible, have only one caregiver for the patient. Other household members should stay in another home or place of residence. If this is not possible, they should stay in another room, or be separated from the patient as much as possible. Use a separate bathroom, if available. Restrict visitors who do not have an essential need to be in the home.  Keep older adults, very young children, and other sick people away from the patient Keep older adults, very young children, and those who have compromised immune systems or chronic health conditions away from the patient. This includes people with chronic heart, lung, or kidney conditions, diabetes, and cancer.  Ensure good ventilation Make sure that shared spaces in the home have good air flow, such as from an air conditioner or an opened window, weather permitting.  Wash your hands often Wash your hands often and thoroughly with soap and water for at least 20 seconds. You can use an alcohol based hand sanitizer if soap and water are not available and if your hands are not visibly dirty. Avoid touching your eyes, nose, and mouth with unwashed hands. Use disposable paper towels to dry your hands. If not available, use dedicated cloth towels and replace them when they become wet.  Wear a  facemask and gloves Wear a disposable facemask at all times in the room and gloves when you touch or have contact with the patients blood, body fluids, and/or secretions or excretions, such as sweat, saliva, sputum, nasal mucus, vomit, urine, or feces.  Ensure the mask fits over your nose and mouth tightly, and do not touch it during use. Throw out disposable facemasks and gloves after using them. Do not reuse. Wash your hands immediately after removing your facemask and gloves. If your personal clothing becomes contaminated, carefully remove clothing and launder. Wash your hands after handling contaminated clothing. Place all used disposable facemasks, gloves, and other waste in a lined container before disposing them with other household waste. Remove gloves and wash your hands immediately after handling these items.  Do not share dishes, glasses, or other household items with the patient Avoid sharing household items. You should not share dishes, drinking glasses, cups, eating utensils, towels, bedding, or other items with a patient who is confirmed to have, or being evaluated for, COVID-19 infection. After the person uses these items, you should wash them thoroughly with soap and water.  Wash laundry thoroughly Immediately remove and wash clothes or bedding that have blood, body fluids, and/or secretions or excretions, such as sweat, saliva, sputum, nasal mucus, vomit, urine, or feces, on them. Wear gloves when  handling laundry from the patient. Read and follow directions on labels of laundry or clothing items and detergent. In general, wash and dry with the warmest temperatures recommended on the label.  Clean all areas the individual has used often Clean all touchable surfaces, such as counters, tabletops, doorknobs, bathroom fixtures, toilets, phones, keyboards, tablets, and bedside tables, every day. Also, clean any surfaces that may have blood, body fluids, and/or secretions or excretions  on them. Wear gloves when cleaning surfaces the patient has come in contact with. Use a diluted bleach solution (e.g., dilute bleach with 1 part bleach and 10 parts water) or a household disinfectant with a label that says EPA-registered for coronaviruses. To make a bleach solution at home, add 1 tablespoon of bleach to 1 quart (4 cups) of water. For a larger supply, add  cup of bleach to 1 gallon (16 cups) of water. Read labels of cleaning products and follow recommendations provided on product labels. Labels contain instructions for safe and effective use of the cleaning product including precautions you should take when applying the product, such as wearing gloves or eye protection and making sure you have good ventilation during use of the product. Remove gloves and wash hands immediately after cleaning.  Monitor yourself for signs and symptoms of illness Caregivers and household members are considered close contacts, should monitor their health, and will be asked to limit movement outside of the home to the extent possible. Follow the monitoring steps for close contacts listed on the symptom monitoring form.   ? If you have additional questions, contact your local health department or call the epidemiologist on call at [phone] (available 24/7). ? This guidance is subject to change. For the most up-to-date guidance from [facility], please refer to their website: [email or web address]

## 2019-08-20 NOTE — ED Notes (Signed)
Patient Alert and oriented to baseline. Stable and ambulatory to baseline. Patient verbalized understanding of the discharge instructions.  Patient belongings were taken by the patient.   

## 2019-08-20 NOTE — ED Notes (Signed)
Patient transported to Ultrasound 

## 2019-08-21 LAB — SARS CORONAVIRUS 2 (TAT 6-24 HRS): SARS Coronavirus 2: NEGATIVE

## 2019-08-22 LAB — GC/CHLAMYDIA PROBE AMP (~~LOC~~) NOT AT ARMC
Chlamydia: NEGATIVE
Neisseria Gonorrhea: NEGATIVE

## 2019-09-13 ENCOUNTER — Ambulatory Visit: Payer: Self-pay | Admitting: Family Medicine

## 2019-09-24 ENCOUNTER — Other Ambulatory Visit: Payer: Self-pay

## 2019-09-24 ENCOUNTER — Encounter: Payer: Self-pay | Admitting: Obstetrics and Gynecology

## 2019-09-24 ENCOUNTER — Ambulatory Visit (INDEPENDENT_AMBULATORY_CARE_PROVIDER_SITE_OTHER): Payer: Self-pay | Admitting: Obstetrics and Gynecology

## 2019-09-24 VITALS — BP 118/65 | HR 104 | Temp 98.9°F | Ht 63.0 in | Wt 161.2 lb

## 2019-09-24 DIAGNOSIS — R1032 Left lower quadrant pain: Secondary | ICD-10-CM

## 2019-09-24 NOTE — Progress Notes (Signed)
Obstetrics and Gynecology New Patient Evaluation  Appointment Date: 09/24/2019  OBGYN Clinic: [facility]  Primary Care Provider: Patient, No Pcp Per  Referring Provider: Emergency Department  Chief Complaint: LLQ pain  History of Present Illness: Christy Cook is a 28 y.o. Hispanic [phone] (Patient's last menstrual period was 07/25/2019 (approximate).), seen for the above chief complaint. Her past medical history is significant for h/o appendectomy.  Patient went [facility] ED on 9/7 for abdominal pain and aches and pains. U/s showed 2.7cm CL cyst on the left side and neg beta hcg, cbc, cmp, u/a  Patient states it started around the time she went to the ED and feels better than when it started,comes and goes, not everyday and lasts for a few hours when it comes on, non radiating. It is helped with a heating pad.   No VB, dysuria, hematuria, blood in BMs, constipation, diarrhea, vag discharge or itching, dyspareunia.   Review of Systems:as noted in the History of Present Illness.   Past Medical History:  Past Medical History:  Diagnosis Date  . Healthy adult on routine physical examination     Past Surgical History:  Past Surgical History:  Procedure Laterality Date  . APPENDECTOMY    . CHOLECYSTECTOMY      Past Obstetrical History:  OB History  Gravida Para Term Preterm AB Living  2 2 2     2   SAB TAB Ectopic Multiple Live Births          2    # Outcome Date GA Lbr Len/2nd Weight Sex Delivery Anes PTL Lv  2 Term 11/05/16 [redacted]w[redacted]d   F Vag-Spont  N LIV  1 Term 08/18/13 [redacted]w[redacted]d   F Vag-Spont  N LIV    Past Gynecological History: As per HPI. Nexplanon expires in two months. No periods prior to this year and can go a few months w/o a period Social History:  Social History   Socioeconomic History  . Marital status: Married    Spouse name: Not on file  . Number of children: 2  . Years of education: Not on file  . Highest education level:  High school graduate  Occupational History  . Not on file  Social Needs  . Financial resource strain: Not hard at all  . Food insecurity    Worry: Never true    Inability: Never true  . Transportation needs    Medical: No    Non-medical: No  Tobacco Use  . Smoking status: Never Smoker  . Smokeless tobacco: Never Used  Substance and Sexual Activity  . Alcohol use: No  . Drug use: Never  . Sexual activity: Yes    Birth control/protection: Implant  Lifestyle  . Physical activity    Days per week: 0 days    Minutes per session: 0 min  . Stress: Not at all  Relationships  . Social connections    Talks on phone: More than three times a week    Gets together: More than three times a week    Attends religious service: More than 4 times per year    Active member of club or organization: No    Attends meetings of clubs or organizations: Never    Relationship status: Married  . Intimate partner violence    Fear of current or ex partner: No    Emotionally abused: No    Physically abused: No    Forced sexual activity: No  Other Topics Concern  . Not on file  Social History Narrative  . Not on file    Family History: History reviewed. No pertinent family history.  Medications Christy Cook had no medications administered during this visit. Current Outpatient Medications  Medication Sig Dispense Refill  . cephALEXin (KEFLEX) 500 MG capsule Take 1 capsule (500 mg total) by mouth 4 (four) times daily. (Patient not taking: Reported on 06/01/2019) 40 capsule 0  . etonogestrel (NEXPLANON) 68 MG IMPL implant 1 each by Subdermal route once.    . methocarbamol (ROBAXIN) 500 MG tablet Take 1 tablet (500 mg total) by mouth 2 (two) times daily. 20 tablet 0  . naproxen (NAPROSYN) 500 MG tablet Take 1 tablet (500 mg total) by mouth 2 (two) times daily. 30 tablet 0   No current facility-administered medications for this visit.     Allergies Patient has no known  allergies.   Physical Exam:  BP 118/65 (BP Location: Left Arm, Patient Position: Sitting, Cuff Size: Normal)   Pulse (!) 104   Temp 98.9 F (37.2 C) (Oral)   Ht 5\' 3"  (1.6 m)   Wt 161 lb 3.2 oz (73.1 kg)   LMP 07/25/2019 (Approximate)   BMI 28.56 kg/m  Body mass index is 28.56 kg/m. General appearance: Well nourished, well developed female in no acute distress.  Cardiovascular: normal s1 and s2.  No murmurs, rubs or gallops. Respiratory:  Clear to auscultation bilateral. Normal respiratory effort Abdomen: positive bowel sounds and no masses, hernias; diffusely non tender to palpation, non distended Skin:  Warm and dry.  Lymphatic:  No inguinal lymphadenopathy.   Pelvic exam: is not limited by body habitus EGBUS: within normal limits, Vagina: within normal limits and with no blood or discharge in the vault, Cervix: normal appearing cervix without tenderness, discharge or lesions. Uterus:  nonenlarged and non tender and Adnexa:  normal adnexa and no mass, fullness, tenderness Rectovaginal: deferred  Laboratory: reviewed  Radiology:  CLINICAL DATA:  Pelvic pain.  Negative beta HCG.  EXAM: TRANSABDOMINAL AND TRANSVAGINAL ULTRASOUND OF PELVIS  DOPPLER ULTRASOUND OF OVARIES  TECHNIQUE: Both transabdominal and transvaginal ultrasound examinations of the pelvis were performed. Transabdominal technique was performed for global imaging of the pelvis including uterus, ovaries, adnexal regions, and pelvic cul-de-sac.  It was necessary to proceed with endovaginal exam following the transabdominal exam to visualize the ovaries and better visualize the uterus and endometrium. Color and duplex Doppler ultrasound was utilized to evaluate blood flow to the ovaries.  COMPARISON:  07/01/2014  FINDINGS: Uterus  Measurements: 7.0 x 5.2 x 3.8 cm = volume: 72 mL. No fibroids or other mass visualized.  Endometrium  Thickness: 7.7 mm.  No focal abnormality visualized.  Right  ovary  Measurements: 2.8 x 1.8 x 1.6 cm = volume: 4.2 mL. Normal appearance/no adnexal mass.  Left ovary  Measurements: 3.6 x 2.8 x 2.5 cm = volume: 13.2 mL. 2.7 cm corpus luteum cyst. Otherwise, unremarkable.  Pulsed Doppler evaluation of both ovaries demonstrates normal low-resistance arterial and venous waveforms.  Other findings  No abnormal free fluid.  IMPRESSION: No significant abnormality. A 2.7 cm left ovarian corpus luteum cyst is noted.   Electronically Signed   By: 07/03/2014 M.D.   On: 08/20/2019 19:57  Assessment: pt stable  Plan:  D/w her that s/s sound very similar to cyst pain and that it sounds like it's resolving. Recommend letting 10/20/2019 know if s/s are still there after november and if so can get an early cycle pelvic u/s; pt is self pay  [facility]  information for pap smears given to patient  Recommend call the HD about nexplanon removal and replacement  RTC PRN  Cornelia Copaharlie Joaopedro Eschbach, Jr MD Attending [facility](Faculty Practice)

## 2019-10-03 ENCOUNTER — Encounter: Payer: Self-pay | Admitting: Family Medicine

## 2019-10-03 ENCOUNTER — Ambulatory Visit: Payer: Self-pay | Attending: Family Medicine | Admitting: Family Medicine

## 2019-10-03 DIAGNOSIS — M545 Low back pain, unspecified: Secondary | ICD-10-CM

## 2019-10-03 DIAGNOSIS — Z603 Acculturation difficulty: Secondary | ICD-10-CM

## 2019-10-03 DIAGNOSIS — G8929 Other chronic pain: Secondary | ICD-10-CM

## 2019-10-03 DIAGNOSIS — R42 Dizziness and giddiness: Secondary | ICD-10-CM

## 2019-10-03 DIAGNOSIS — Z789 Other specified health status: Secondary | ICD-10-CM | POA: Insufficient documentation

## 2019-10-03 DIAGNOSIS — R0981 Nasal congestion: Secondary | ICD-10-CM

## 2019-10-03 DIAGNOSIS — R519 Headache, unspecified: Secondary | ICD-10-CM

## 2019-10-03 DIAGNOSIS — Z758 Other problems related to medical facilities and other health care: Secondary | ICD-10-CM

## 2019-10-03 MED ORDER — CETIRIZINE HCL 5 MG PO TABS: ORAL_TABLET | ORAL | 5 refills | Status: DC

## 2019-10-03 MED ORDER — IBUPROFEN 600 MG PO TABS: 600.0000 mg | ORAL_TABLET | Freq: Three times a day (TID) | ORAL | 1 refills | Status: AC | PRN

## 2019-10-03 MED ORDER — PROMETHAZINE HCL 12.5 MG PO TABS: 12.5000 mg | ORAL_TABLET | Freq: Three times a day (TID) | ORAL | 0 refills | Status: DC | PRN

## 2019-10-03 MED ORDER — PREDNISONE 20 MG PO TABS: ORAL_TABLET | ORAL | 0 refills | Status: DC

## 2019-10-03 MED FILL — IBUPROFEN 600 MG TABLET: 600 | 20 days supply | Qty: 60 | Fill #0

## 2019-10-03 MED FILL — PROMETHAZINE 12.5 MG TABLET: 12.5 | 6 days supply | Qty: 20 | Fill #0

## 2019-10-03 MED FILL — predniSONE 20 MG TABS: 20 | 9 days supply | Qty: 11 | Fill #0

## 2019-10-03 NOTE — Progress Notes (Signed)
Virtual Visit via Telephone Note  I connected with Christy Cook on 10/03/19 at 10:50 AM EDT by telephone and verified that I am speaking with the correct person using two identifiers.   I discussed the limitations, risks, security and privacy concerns of performing an evaluation and management service by telephone and the availability of in person appointments. I also discussed with the patient that there may be a patient responsible charge related to this service. The patient expressed understanding and agreed to proceed.  Patient Location: Home Provider Location: [facility] office Others participating in call: call initiated by [place], CMA who obtained a Spanish speaking interpreter, Janyth Pupa ID # [phone]  History of Present Illness:       28 yo female, new to the practice, who reports that she has been having daily headaches and back pain. Back pain is in the lower back near the left hip. Pain is sometimes relieved with the use of over the counter pain medication and the use of a heating pad. Pain feels as if someone is poking something into the area of the soreness that occurs after an injury. Pain is about an 8 on a 0-10 scale. Also occurs in the middle of her lower back. No urinary frequency or dysuria.         Her headaches are up to a 10 on a 0-10 scale- sometimes occurs in the nape of her neck, sometimes on the side of her head and sometimes in the front of her head. No prior diagnosis of migraines. No history of head injury. When she gets the severe headaches she does have nausea and sensitivity to light. She has had the headaches for about 1 and 1/2 months. Headaches have occurred in the past but never hurt this much. She does have some current ear pain and she has some dizziness as well as some dizziness when she bends over or lies down. Sometimes feels like the room is spinning when she lies down. She also feels as if she has pressure in her ears.     Past Medical  History:  Diagnosis Date  . Healthy adult on routine physical examination     Past Surgical History:  Procedure Laterality Date  . APPENDECTOMY    . CHOLECYSTECTOMY      Family History  Problem Relation Age of Onset  . Diabetes Maternal Grandmother   . Cancer Neg Hx   . Heart disease Neg Hx   . Hypertension Neg Hx     Social History   Tobacco Use  . Smoking status: Never Smoker  . Smokeless tobacco: Never Used  Substance Use Topics  . Alcohol use: No  . Drug use: Never     No Known Allergies     Observations/Objective: No vital signs or physical exam conducted as visit was done via telephone  Assessment and Plan: 1. Chronic nonintractable headache, unspecified headache type Discussed with the patient that it sounds as if she has 2 different types of headaches.  Patient likely has migraine type headaches which are accompanied by light sensitivity and nausea.  Prescription provided for Phenergan to take as needed for nausea associated with headaches and prescription for ibuprofen for headache pain.  Patient also likely has sinus pressure headaches currently and prescription provided for prednisone taper and 5 mg cetirizine to take at bedtime for congestion.  She will also be referred to neurology for further evaluation and treatment of her headaches - predniSONE (DELTASONE) 20 MG tablet; 3 pills  the first day then 2 pills daily for 2 days, then 1 pill daily for 2 days and 1/2 pill daily x 4 days; take after eating  Dispense: 11 tablet; Refill: 0 - promethazine (PHENERGAN) 12.5 MG tablet; Take 1 tablet (12.5 mg total) by mouth every 8 (eight) hours as needed for nausea or vomiting.  Dispense: 20 tablet; Refill: 0 - ibuprofen (ADVIL) 600 MG tablet; Take 1 tablet (600 mg total) by mouth every 8 (eight) hours as needed for headache or moderate pain. Take after eating  Dispense: 60 tablet; Refill: 1  2. Nasal congestion Patient with complaint of current nasal congestion and sinus  pressure along with dizziness.  Symptoms are likely related to allergic rhinitis or eustachian tube dysfunction.  Prescription provided for prednisone taper and cetirizine call or return to clinic if any acute worsening of symptoms otherwise 2-week follow-up. - predniSONE (DELTASONE) 20 MG tablet; 3 pills the first day then 2 pills daily for 2 days, then 1 pill daily for 2 days and 1/2 pill daily x 4 days; take after eating  Dispense: 11 tablet; Refill: 0 - cetirizine (ZYRTEC) 5 MG tablet; One pill at bedtime as needed for nasal congestion  Dispense: 30 tablet; Refill: 5  3. Acute left-sided low back pain without sciatica Patient with complaint of acute left-sided low back pain without sciatica.  Discussed with the patient through the interpreter that based on her symptoms, her back pain is likely musculoskeletal in origin.  She should continue the use of heating pad or warm moist heat to the area, prescription for ibuprofen and patient is also being placed on a prednisone taper for sinus pressure/headache and nasal congestion and that should also help with her back pain. - predniSONE (DELTASONE) 20 MG tablet; 3 pills the first day then 2 pills daily for 2 days, then 1 pill daily for 2 days and 1/2 pill daily x 4 days; take after eating  Dispense: 11 tablet; Refill: 0 - ibuprofen (ADVIL) 600 MG tablet; Take 1 tablet (600 mg total) by mouth every 8 (eight) hours as needed for headache or moderate pain. Take after eating  Dispense: 60 tablet; Refill: 1  4. Dizziness Discussed with the patient that I believe that her dizziness may be related to her nasal congestion/allergic rhinitis and facial pressure/sinus pressure.  Patient should take the prescribed prednisone, cetirizine for nasal congestion and remain well-hydrated.  Patient will follow-up in approximately 2 weeks.  If she has continued dizziness at that time she will likely also have blood work such as BMP to check electrolytes and CBC to check for  anemia if her dizziness continues.  If she has acute dizziness or any concerns regarding dizziness, headaches then she should go to urgent care or emergency department for further evaluation, otherwise 2-week follow-up.  5. Language barrier Spanish-speaking interpreter obtained through [facility] interpretation services due to language barrier  Follow Up Instructions: Follow-up in 2 to 3 weeks but sooner if symptoms worsen or no improvement    I discussed the assessment and treatment plan with the patient. The patient was provided an opportunity to ask questions and all were answered. The patient agreed with the plan and demonstrated an understanding of the instructions.   The patient was advised to call back or seek an in-person evaluation if the symptoms worsen or if the condition fails to improve as anticipated.  I provided 26 minutes of non-face-to-face time during this encounter.   Antony Blackbird, MD

## 2019-10-03 NOTE — Progress Notes (Signed)
Patient verified DOB Patient has not eaten today. Patient has not taken medication today. Patient complains of HA's for the past month and a half. Patient states the HA's are more present in the morning. HA's are located in the forehead and tempora. Patient denies extreme stress. Patient complains of pressure in the nose and into the forehead. Patient uses ibuprofen with minimal relief at times.

## 2019-10-04 ENCOUNTER — Encounter: Payer: Self-pay | Admitting: Neurology

## 2019-10-05 ENCOUNTER — Ambulatory Visit: Payer: Self-pay | Admitting: Pharmacist

## 2019-10-11 ENCOUNTER — Other Ambulatory Visit: Payer: Self-pay

## 2019-10-11 ENCOUNTER — Ambulatory Visit (HOSPITAL_BASED_OUTPATIENT_CLINIC_OR_DEPARTMENT_OTHER): Payer: Self-pay | Admitting: Pharmacist

## 2019-10-11 ENCOUNTER — Ambulatory Visit: Payer: Self-pay | Attending: Family Medicine

## 2019-10-11 DIAGNOSIS — Z23 Encounter for immunization: Secondary | ICD-10-CM

## 2019-10-11 NOTE — Progress Notes (Signed)
Patient presents for vaccination against tetanus and influenza per orders of Dr. Fulp. Consent given. Counseling provided. No contraindications exists. Vaccine administered without incident.   

## 2019-10-25 ENCOUNTER — Telehealth: Payer: Self-pay | Admitting: Family Medicine

## 2019-10-25 NOTE — Telephone Encounter (Signed)
Pt was sent a letter from financial dept. Inform them, that the application they submitted was incomplete, since they were missing some documentation at the time of the appointment, Pt need to reschedule and resubmit all new papers and application for [employer] and [employer], P.S. old documents has been sent back by mail to the Pt and Pt. need to make a new appt. 

## 2019-10-28 NOTE — Progress Notes (Addendum)
NEUROLOGY CONSULTATION NOTE  Christy Cook MRN: [id number] DOB: 08/08/1991  Referring provider: Cain Saupe, MD Primary care provider: Cain Saupe, MD  Reason for consult:  Headache and dizziness  HISTORY OF PRESENT ILLNESS: Christy Cook is a 28 year old female who presents for headache and dizziness.  She is accompanied by a Spanish-speaking interpreter.  History supplemented by referring provider note.  She reports history of headaches since October.  No prior history of headaches.  No head trauma or other preceding trigger.  They are a severe throbbing frontal/temporal (bilaterally) or top of head.  There is associated nausea and photophobia and blurred vision.  No associated vomiting, phonophobia or unilateral numbness and weakness.  Sometimes she has dull headache.  They typically last a couple of hours to all day.  Ibuprofen 600mg  helped.  They were daily up until two weeks ago after receiving cupping therapy.  She hasn't had one in 2 weeks.    She had onset of dizziness along with the headache.  She describes it as a spinning sensation.  When she gets dizzy, she has associated bilateral ear pain and aural fullness.  Not positional, it occurs spontaneously.  It would last 3 minutes.  They were infrequent, not daily.  Last spell was a week ago.  She was prescribed by her PCP a prednisone taper to address suspected allergic rhinitis or eustachian tube dysfunction.    Current NSAIDS:  Ibuprofen 600mg  Current analgesics:  none Current triptans:  none Current ergotamine:  none Current anti-emetic:  Promethazine 12.5mg  Current muscle relaxants:  none Current anti-anxiolytic:  none Current sleep aide:  none Current Antihypertensive medications:  none Current Antidepressant medications:  none Current Anticonvulsant medications:  none Current anti-CGRP:  none Current Vitamins/Herbal/Supplements:  none Current Antihistamines/Decongestants:  Zyrtec Other  therapy:  Cupping  Hormone/birth control:  Nexplanon  Past NSAIDS:  none Past analgesics:  none Past abortive triptans:  none Past abortive ergotamine:  none Past muscle relaxants:  none Past anti-emetic:  none Past antihypertensive medications:  none Past antidepressant medications:  none Past anticonvulsant medications:  none Past anti-CGRP:  none Past vitamins/Herbal/Supplements:  none Past antihistamines/decongestants:  none Other past therapies:  none  Caffeine:  Coffee or tea rarely. Diet:  Not too much water Depression:  no; Anxiety:  no Other pain:  Back pain Sleep hygiene:  varies Family history of headache:  mom  CBC and CMP from 08/20/2019 were unremarkable.   PAST MEDICAL HISTORY: Past Medical History:  Diagnosis Date  . Healthy adult on routine physical examination     PAST SURGICAL HISTORY: Past Surgical History:  Procedure Laterality Date  . APPENDECTOMY    . CHOLECYSTECTOMY      MEDICATIONS: Current Outpatient Medications on File Prior to Visit  Medication Sig Dispense Refill  . cetirizine (ZYRTEC) 5 MG tablet One pill at bedtime as needed for nasal congestion 30 tablet 5  . etonogestrel (NEXPLANON) 68 MG IMPL implant 1 each by Subdermal route once.    ibuprofen (ADVIL) 600 MG tablet Take 1 tablet (600 mg total) by mouth every 8 (eight) hours as needed for headache or moderate pain. Take after eating 60 tablet 1  . predniSONE (DELTASONE) 20 MG tablet 3 pills the first day then 2 pills daily for 2 days, then 1 pill daily for 2 days and 1/2 pill daily x 4 days; take after eating 11 tablet 0  . promethazine (PHENERGAN) 12.5 MG tablet Take 1 tablet (12.5 mg total) by mouth every 8 (  eight) hours as needed for nausea or vomiting. 20 tablet 0   No current facility-administered medications on file prior to visit.     ALLERGIES: No Known Allergies  FAMILY HISTORY: Family History  Problem Relation Age of Onset  . Diabetes Maternal Grandmother   .  Cancer Neg Hx   . Heart disease Neg Hx   . Hypertension Neg Hx     SOCIAL HISTORY: Social History   Socioeconomic History  . Marital status: Married    Spouse name: Not on file  . Number of children: 2  . Years of education: Not on file  . Highest education level: High school graduate  Occupational History  . Not on file  Social Needs  . Financial resource strain: Not hard at all  . Food insecurity    Worry: Never true    Inability: Never true  . Transportation needs    Medical: No    Non-medical: No  Tobacco Use  . Smoking status: Never Smoker  . Smokeless tobacco: Never Used  Substance and Sexual Activity  . Alcohol use: No  . Drug use: Never  . Sexual activity: Yes    Birth control/protection: Implant  Lifestyle  . Physical activity    Days per week: 0 days    Minutes per session: 0 min  . Stress: Not at all  Relationships  . Social connections    Talks on phone: More than three times a week    Gets together: More than three times a week    Attends religious service: More than 4 times per year    Active member of club or organization: No    Attends meetings of clubs or organizations: Never    Relationship status: Married  . Intimate partner violence    Fear of current or ex partner: No    Emotionally abused: No    Physically abused: No    Forced sexual activity: No  Other Topics Concern  . Not on file  Social History Narrative  . Not on file    REVIEW OF SYSTEMS: Constitutional: No fevers, chills, or sweats, no generalized fatigue, change in appetite Eyes: No visual changes, double vision, eye pain Ear, nose and throat: No hearing loss, ear pain, nasal congestion, sore throat Cardiovascular: No chest pain, palpitations Respiratory:  No shortness of breath at rest or with exertion, wheezes GastrointestinaI: No nausea, vomiting, diarrhea, abdominal pain, fecal incontinence Genitourinary:  No dysuria, urinary retention or frequency Musculoskeletal:  No  neck pain, back pain Integumentary: No rash, pruritus, skin lesions Neurological: as above Psychiatric: No depression, insomnia, anxiety Endocrine: No palpitations, fatigue, diaphoresis, mood swings, change in appetite, change in weight, increased thirst Hematologic/Lymphatic:  No purpura, petechiae. Allergic/Immunologic: no itchy/runny eyes, nasal congestion, recent allergic reactions, rashes  PHYSICAL EXAM: Blood pressure 119/63, pulse 87, height  (1.6 m), weight 165 lb 4.8 oz (75 kg), SpO2 100 %. General: No acute distress.  Patient appears well-groomed.   Head:  Normocephalic/atraumatic Eyes:  fundi examined but not visualized Neck: supple, no paraspinal tenderness, full range of motion Back: No paraspinal tenderness Heart: regular rate and rhythm Lungs: Clear to auscultation bilaterally. Vascular: No carotid bruits. Neurological Exam: Mental status: alert and oriented to person, place, and time, recent and remote memory intact, fund of knowledge intact, attention and concentration intact, speech fluent and not dysarthric, language intact. Cranial nerves: CN I: not tested CN II: pupils equal, round and reactive to light, visual fields intact CN III, IV, VI:  full range of motion, no nystagmus, no ptosis CN V: facial sensation intact CN VII: upper and lower face symmetric CN VIII: hearing intact CN IX, X: gag intact, uvula midline CN XI: sternocleidomastoid and trapezius muscles intact CN XII: tongue midline Bulk & Tone: normal, no fasciculations. Motor:  5/5 throughout  Sensation:  temperature and vibration sensation intact.   Deep Tendon Reflexes:  2+ throughout, toes downgoing.   Finger to nose testing:  Without dysmetria.   Heel to shin:  Without dysmetria.   Gait:  Normal station and stride.  Romberg negative.  IMPRESSION: 1.  Migraine without aura, without status migrainosus, not intractable.  Possibly triggered by increased stress (children home from school due  to [place]) and seasonal allergies.  Resolved, so no preventative at this time. 2.  Benign paroxysmal positional vertigo, possibly related to seasonal allergies, resolved.  PLAN: 1.  Sumatriptan for abortive migraine therapy 2.  Limit use of pain relievers to no more than 2 days out of week to prevent risk of rebound or medication-overuse headache. 3.  Follow up in 4 months.  Thank you for allowing me to take part in the care of this patient.  Metta Clines, DO  CC:  Antony Blackbird, MD

## 2019-10-29 ENCOUNTER — Ambulatory Visit (INDEPENDENT_AMBULATORY_CARE_PROVIDER_SITE_OTHER): Payer: Self-pay | Admitting: Neurology

## 2019-10-29 ENCOUNTER — Other Ambulatory Visit: Payer: Self-pay

## 2019-10-29 ENCOUNTER — Encounter: Payer: Self-pay | Admitting: Neurology

## 2019-10-29 VITALS — BP 119/63 | HR 87 | Ht 63.0 in | Wt 165.3 lb

## 2019-10-29 DIAGNOSIS — G43009 Migraine without aura, not intractable, without status migrainosus: Secondary | ICD-10-CM

## 2019-10-29 DIAGNOSIS — H8110 Benign paroxysmal vertigo, unspecified ear: Secondary | ICD-10-CM

## 2019-10-29 MED ORDER — SUMATRIPTAN SUCCINATE 100 MG PO TABS: ORAL_TABLET | ORAL | 3 refills | Status: AC

## 2019-10-29 NOTE — Patient Instructions (Addendum)
  When you get a migraine, take the sumatriptan as prescribed Limit use of pain relievers to no more than 2 days out of week to prevent risk of rebound or medication-overuse headache.  Follow up in 4 months.   Cuando tenga migraa, tome sumatriptn segn lo prescrito. Limite el uso de analgsicos a no ms de 2 das a la semana para prevenir el riesgo de dolor de cabeza por rebote o por uso excesivo de medicamentos.  Seguimiento en 4 meses.

## 2020-01-28 ENCOUNTER — Encounter: Payer: Self-pay | Admitting: Neurology

## 2020-02-25 NOTE — Progress Notes (Deleted)
NEUROLOGY FOLLOW UP OFFICE NOTE  [name] [id number]  HISTORY OF PRESENT ILLNESS: Christy Cook is a 29 year old female who follows up for migraines.  She is accompanied by a Spanish-speaking interpreter.   UPDATE: Intensity:  *** Duration:  *** Frequency:  *** Frequency of abortive medication: *** Current NSAIDS:  Ibuprofen 600mg  Current analgesics:  none Current triptans:  sumatriptan 100mg  Current ergotamine:  none Current anti-emetic:  Promethazine 12.5mg  Current muscle relaxants:  none Current anti-anxiolytic:  none Current sleep aide:  none Current Antihypertensive medications:  none Current Antidepressant medications:  none Current Anticonvulsant medications:  none Current anti-CGRP:  none Current Vitamins/Herbal/Supplements:  none Current Antihistamines/Decongestants:  Zyrtec Other therapy:  Cupping  Hormone/birth control:  Nexplanon  Caffeine:  Coffee or tea rarely. Diet:  Not too much water Depression:  no; Anxiety:  no Other pain:  Back pain Sleep hygiene:  varies   HISTORY: She reports history of headaches since October 2020.  No prior history of headaches.  No head trauma or other preceding trigger.  They are a severe throbbing frontal/temporal (bilaterally) or top of head.  There is associated nausea and photophobia and blurred vision.  No associated vomiting, phonophobia or unilateral numbness and weakness.  Sometimes she has dull headache.  They typically last a couple of hours to all day.  Ibuprofen 600mg  helped.  They were daily up until two weeks ago after receiving cupping therapy.  She hasn't had one in 2 weeks.    She had onset of dizziness along with the headache.  She describes it as a spinning sensation.  When she gets dizzy, she has associated bilateral ear pain and aural fullness.  Not positional, it occurs spontaneously.  It would last 3 minutes.  They were infrequent, not daily.  Last spell was a week  ago.  She was prescribed by her PCP a prednisone taper to address suspected allergic rhinitis or eustachian tube dysfunction.     Past NSAIDS:  none Past analgesics:  none Past abortive triptans:  none Past abortive ergotamine:  none Past muscle relaxants:  none Past anti-emetic:  none Past antihypertensive medications:  none Past antidepressant medications:  none Past anticonvulsant medications:  none Past anti-CGRP:  none Past vitamins/Herbal/Supplements:  none Past antihistamines/decongestants:  none Other past therapies:  none   Family history of headache:  mom  PAST MEDICAL HISTORY: Past Medical History:  Diagnosis Date  . Healthy adult on routine physical examination     MEDICATIONS: Current Outpatient Medications on File Prior to Visit  Medication Sig Dispense Refill  . etonogestrel (NEXPLANON) 68 MG IMPL implant 1 each by Subdermal route once.    Marland Kitchen ibuprofen (ADVIL) 600 MG tablet Take 1 tablet (600 mg total) by mouth every 8 (eight) hours as needed for headache or moderate pain. Take after eating 60 tablet 1  . SUMAtriptan (IMITREX) 100 MG tablet Take 1 tablet earliest onset of headache.  May repeat in 2 hours if headache persists or recurs.  Maximum 2 tablets in 24 hours 10 tablet 3   No current facility-administered medications on file prior to visit.    ALLERGIES: No Known Allergies  FAMILY HISTORY: Family History  Problem Relation Age of Onset  . Migraines Mother   . Diabetes Maternal Grandmother   . Healthy Brother   . Healthy Child   . Cancer Neg Hx   . Heart disease Neg Hx   . Hypertension Neg Hx    SOCIAL HISTORY: Social History   Socioeconomic  History  . Marital status: Married    Spouse name: Not on file  . Number of children: 2  . Years of education: Not on file  . Highest education level: High school graduate  Occupational History  . Not on file  Tobacco Use  . Smoking status: Never Smoker  . Smokeless tobacco: Never Used   Substance and Sexual Activity  . Alcohol use: No  . Drug use: Never  . Sexual activity: Yes    Birth control/protection: Implant  Other Topics Concern  . Not on file  Social History Narrative   Right handed   Married   2 children   Drinks coffee sometime, and soda daily, tea sometimes    Social Determinants of Health   Financial Resource Strain: Low Risk   . Difficulty of Paying Living Expenses: Not hard at all  Food Insecurity: No Food Insecurity  . Worried About Programme researcher, broadcasting/film/video in the Last Year: Never true  . Ran Out of Food in the Last Year: Never true  Transportation Needs: No Transportation Needs  . Lack of Transportation (Medical): No  . Lack of Transportation (Non-Medical): No  Physical Activity: Inactive  . Days of Exercise per Week: 0 days  . Minutes of Exercise per Session: 0 min  Stress: No Stress Concern Present  . Feeling of Stress : Not at all  Social Connections: Slightly Isolated  . Frequency of Communication with Friends and Family: More than three times a week  . Frequency of Social Gatherings with Friends and Family: More than three times a week  . Attends Religious Services: More than 4 times per year  . Active Member of Clubs or Organizations: No  . Attends Banker Meetings: Never  . Marital Status: Married  Catering manager Violence: Not At Risk  . Fear of Current or Ex-Partner: No  . Emotionally Abused: No  . Physically Abused: No  . Sexually Abused: No    REVIEW OF SYSTEMS: Constitutional: No fevers, chills, or sweats, no generalized fatigue, change in appetite Eyes: No visual changes, double vision, eye pain Ear, nose and throat: No hearing loss, ear pain, nasal congestion, sore throat Cardiovascular: No chest pain, palpitations Respiratory:  No shortness of breath at rest or with exertion, wheezes GastrointestinaI: No nausea, vomiting, diarrhea, abdominal pain, fecal incontinence Genitourinary:  No dysuria, urinary  retention or frequency Musculoskeletal:  No neck pain, back pain Integumentary: No rash, pruritus, skin lesions Neurological: as above Psychiatric: No depression, insomnia, anxiety Endocrine: No palpitations, fatigue, diaphoresis, mood swings, change in appetite, change in weight, increased thirst Hematologic/Lymphatic:  No purpura, petechiae. Allergic/Immunologic: no itchy/runny eyes, nasal congestion, recent allergic reactions, rashes  PHYSICAL EXAM: *** General: No acute distress.  Patient appears well-groomed.   Head:  Normocephalic/atraumatic Eyes:  Fundi examined but not visualized Neck: supple, no paraspinal tenderness, full range of motion Heart:  Regular rate and rhythm Lungs:  Clear to auscultation bilaterally Back: No paraspinal tenderness Neurological Exam: alert and oriented to person, place, and time. Attention span and concentration intact, recent and remote memory intact, fund of knowledge intact.  Speech fluent and not dysarthric, language intact.  CN II-XII intact. Bulk and tone normal, muscle strength 5/5 throughout.  Sensation to light touch, temperature and vibration intact.  Deep tendon reflexes 2+ throughout, toes downgoing.  Finger to nose and heel to shin testing intact.  Gait normal, Romberg negative.  IMPRESSION: Migraine without aura, without status migrainosus, not intractable  PLAN: 1.  For preventative  management, *** 2.  For abortive therapy, *** 3.  Limit use of pain relievers to no more than 2 days out of week to prevent risk of rebound or medication-overuse headache. 4.  Keep headache diary 5.  Exercise, hydration, caffeine cessation, sleep hygiene, monitor for and avoid triggers 6. Follow up ***   Shon Millet, DO  CC: Cain Saupe, MD

## 2020-02-26 ENCOUNTER — Ambulatory Visit: Payer: Self-pay | Admitting: Neurology

## 2020-07-17 IMAGING — CR CHEST - 2 VIEW
2 series · 2 of 2 positions shown · non-contrast
Comparison: None.

CLINICAL DATA: 27 y/o F; left-sided anterior and posterior chest
pain with tingling down the left arm.

EXAM:
CHEST - 2 VIEW

[chest pa]
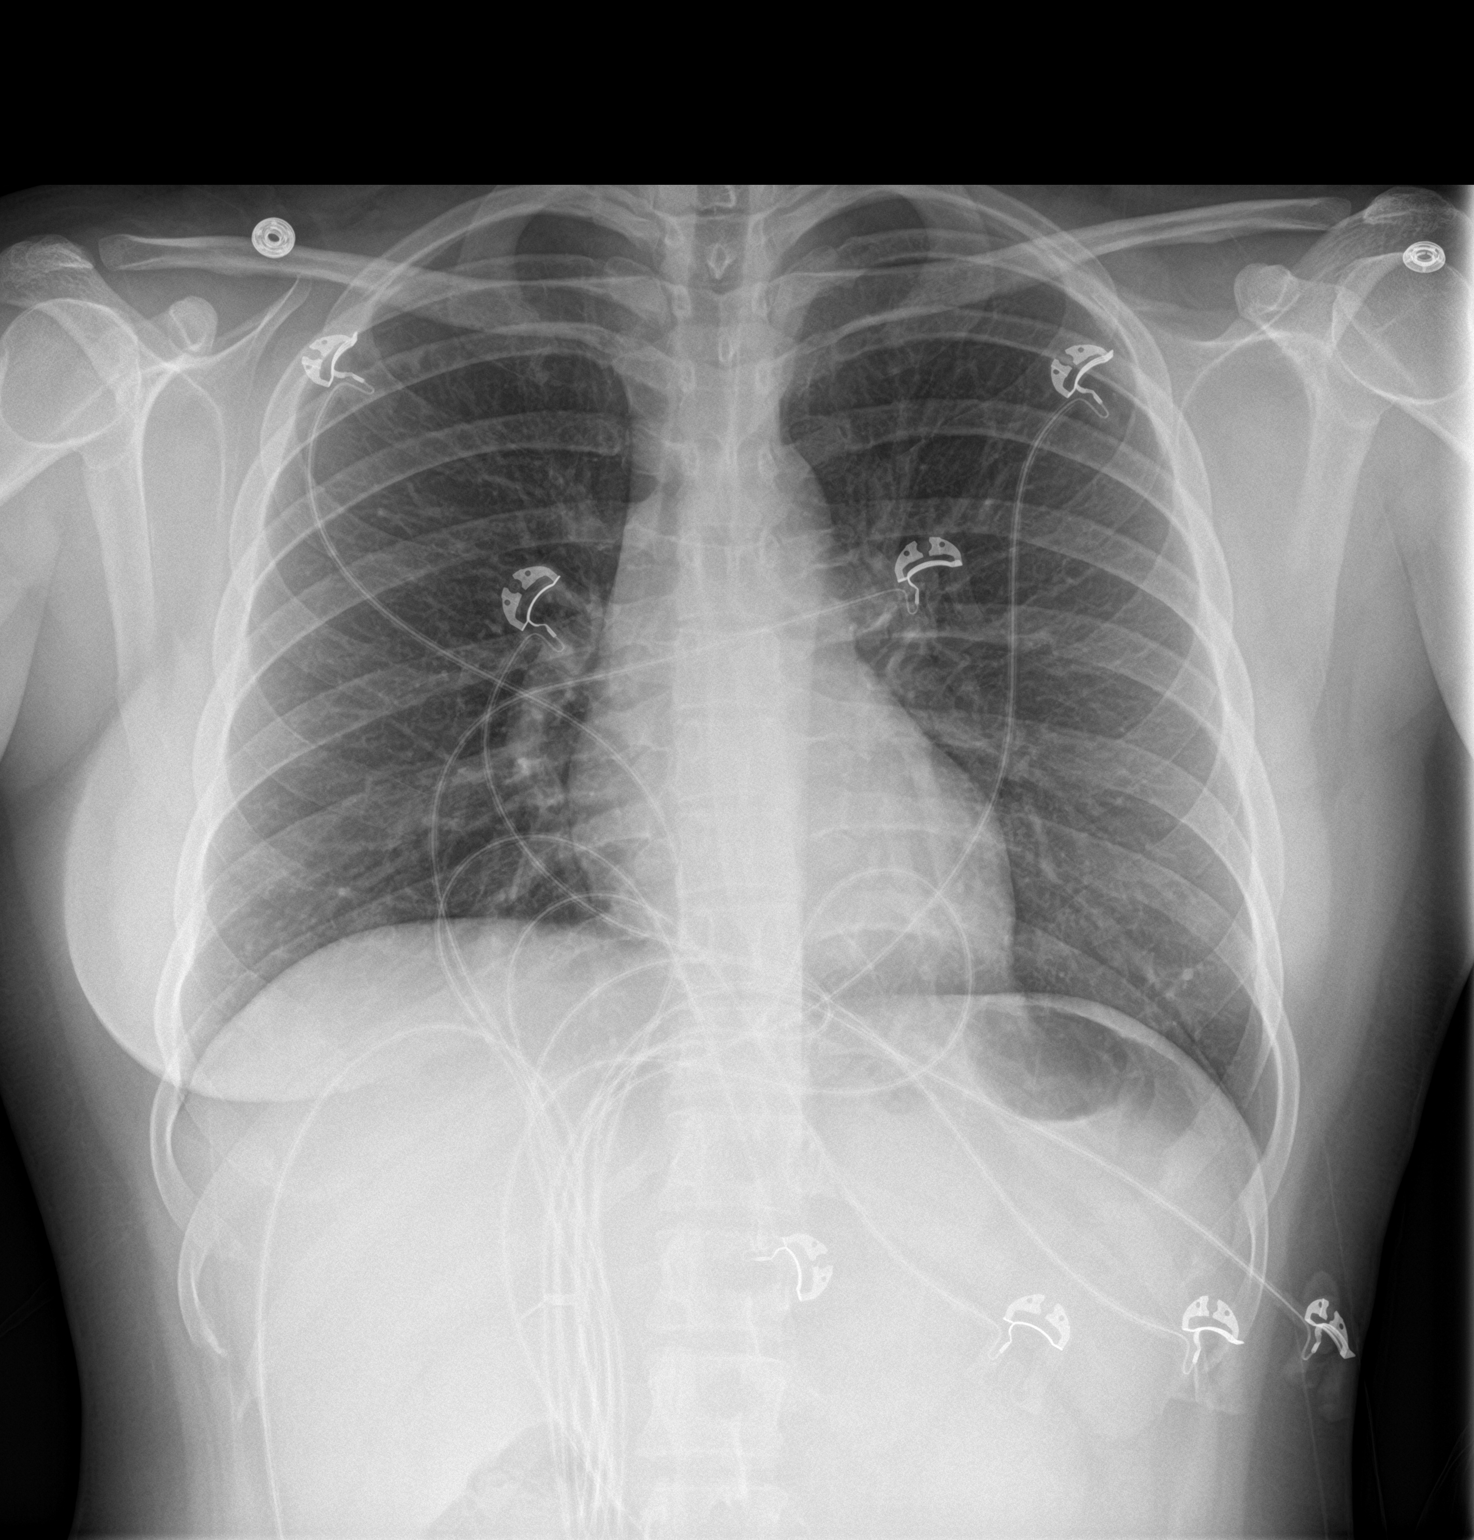

[chest lat]
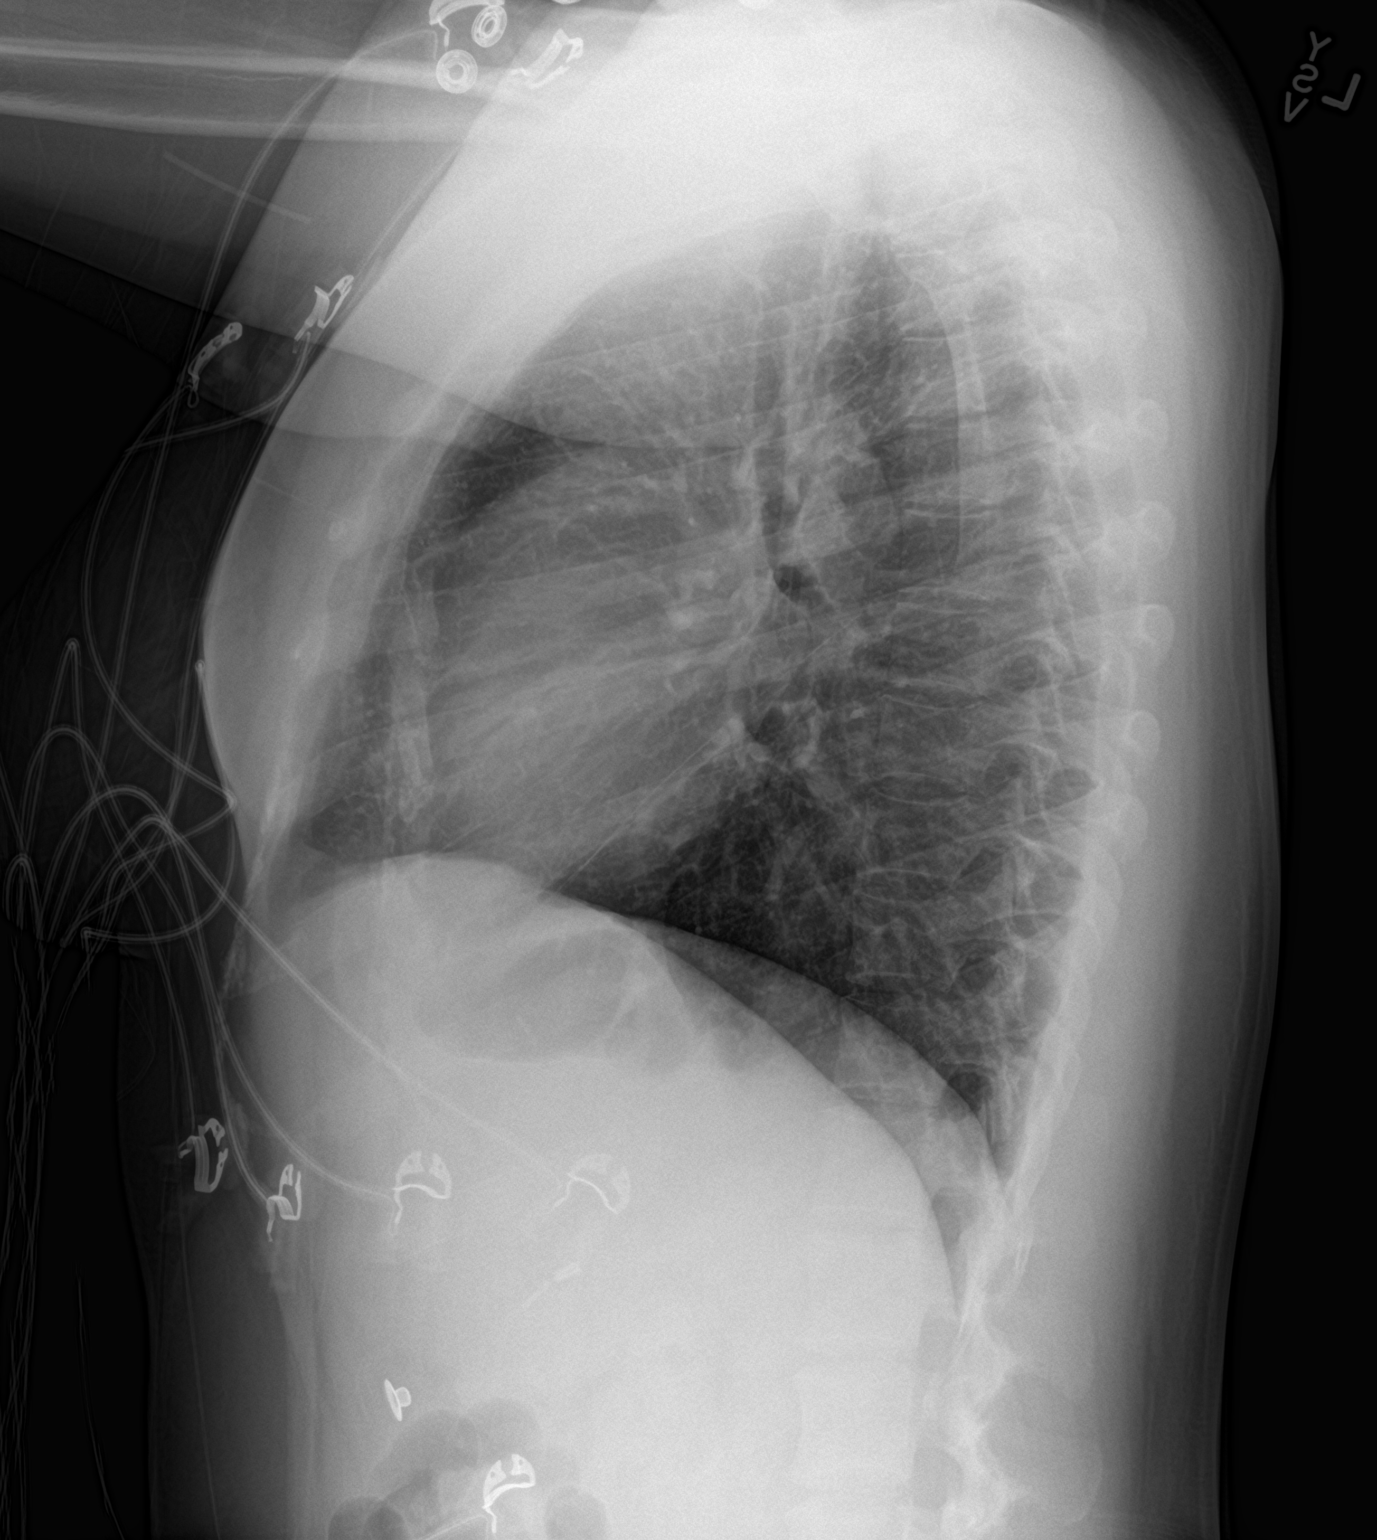

[2 of 2 positions shown; findings below may reference images not displayed]

FINDINGS: The heart size and mediastinal contours are within normal limits.
Both lungs are clear. The visualized skeletal structures are
unremarkable.
IMPRESSION: No active cardiopulmonary disease.

## 2020-10-05 IMAGING — US US PELVIS COMPLETE
1 series · 13 of 25 positions shown · non-contrast
Comparison: 07/01/2014

CLINICAL DATA: Pelvic pain.  Negative beta HCG.

EXAM:
TRANSABDOMINAL AND TRANSVAGINAL ULTRASOUND OF PELVIS
DOPPLER ULTRASOUND OF OVARIES
TECHNIQUE: Both transabdominal and transvaginal ultrasound examinations of the
pelvis were performed. Transabdominal technique was performed for
global imaging of the pelvis including uterus, ovaries, adnexal
regions, and pelvic cul-de-sac.
It was necessary to proceed with endovaginal exam following the
transabdominal exam to visualize the ovaries and better visualize
the uterus and endometrium. Color and duplex Doppler ultrasound was
utilized to evaluate blood flow to the ovaries.

[Series 1: us pelvis complete · 78 acquisitions, 13 frames shown]
[im 1/78]
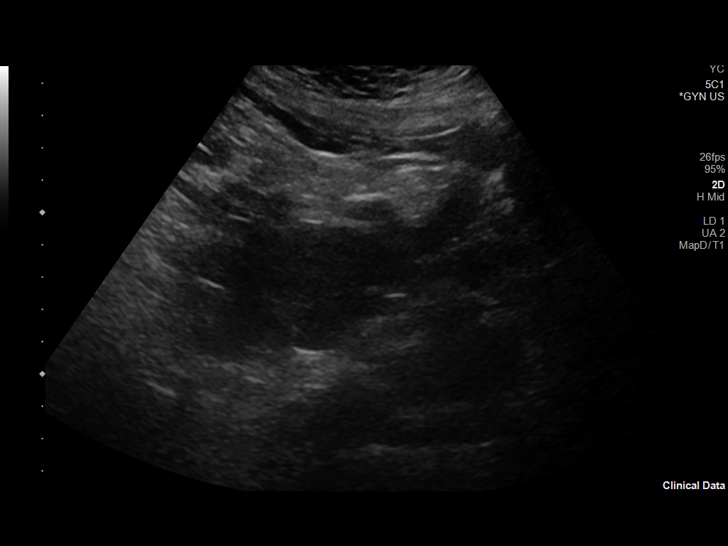
[im 7/78]
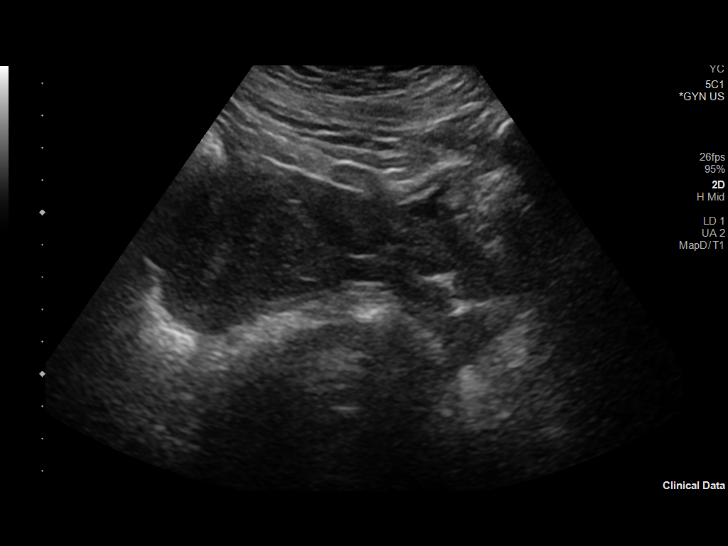
[im 13/78]
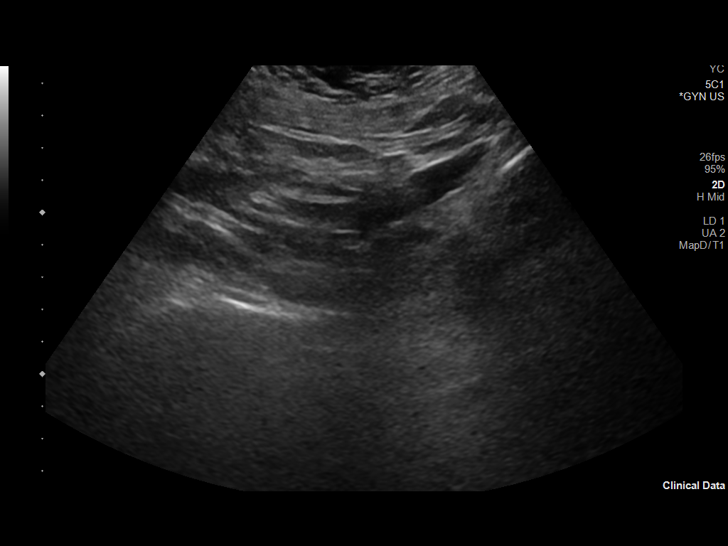
[im 20/78]
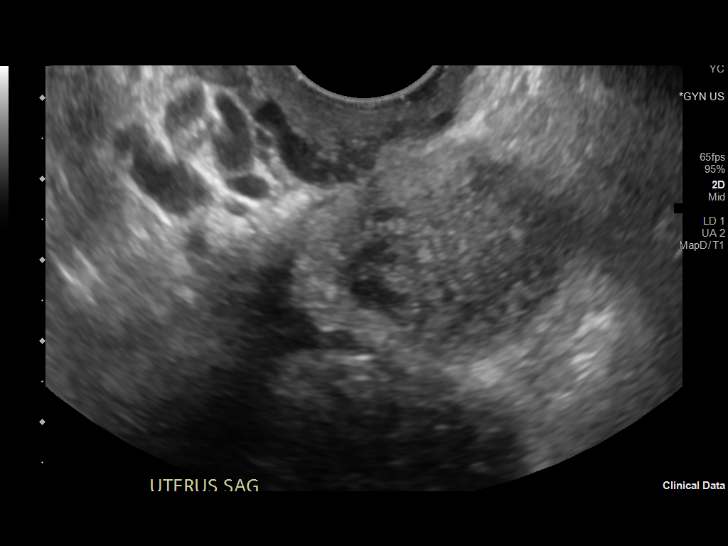
[im 26/78]
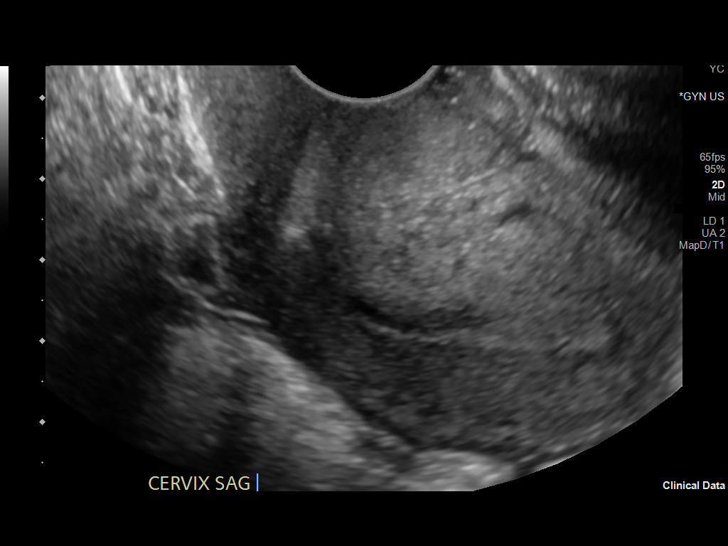
[im 33/78]
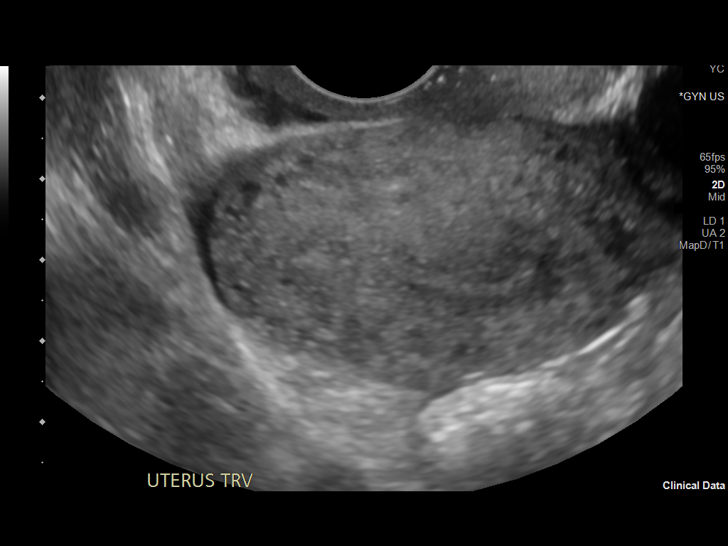
[im 39/78]
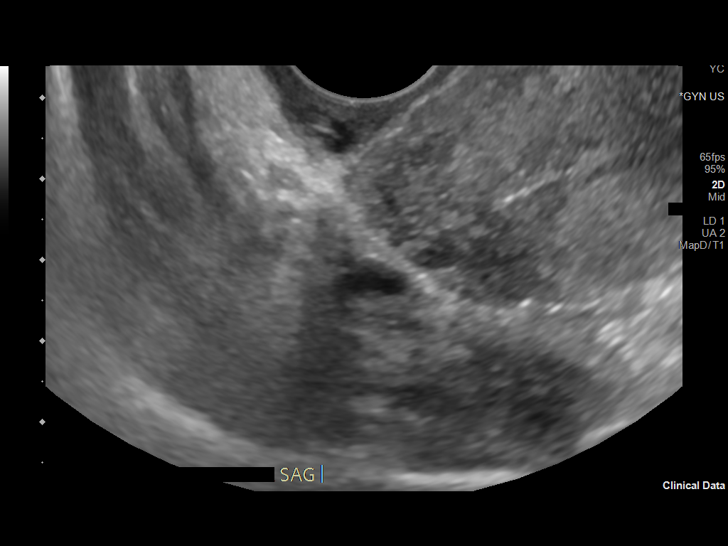
[im 45/78]
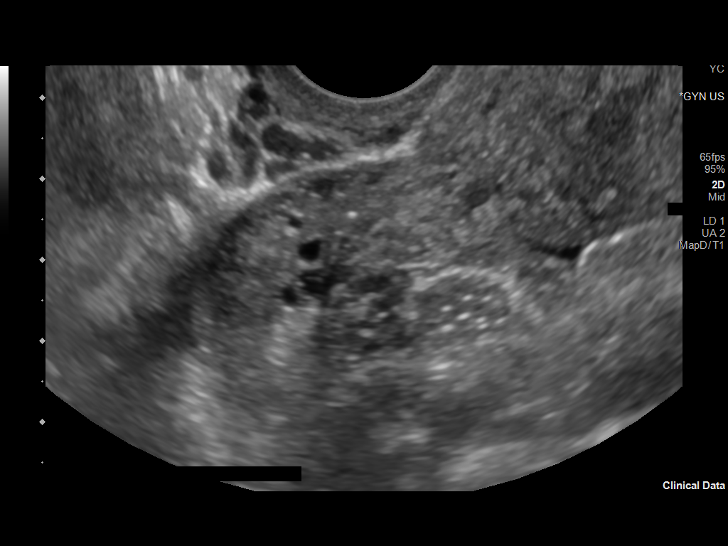
[im 52/78]
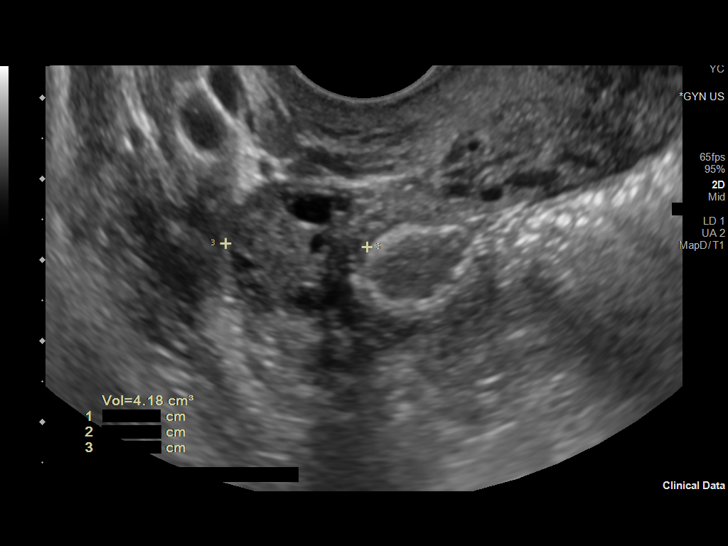
[im 58/78]
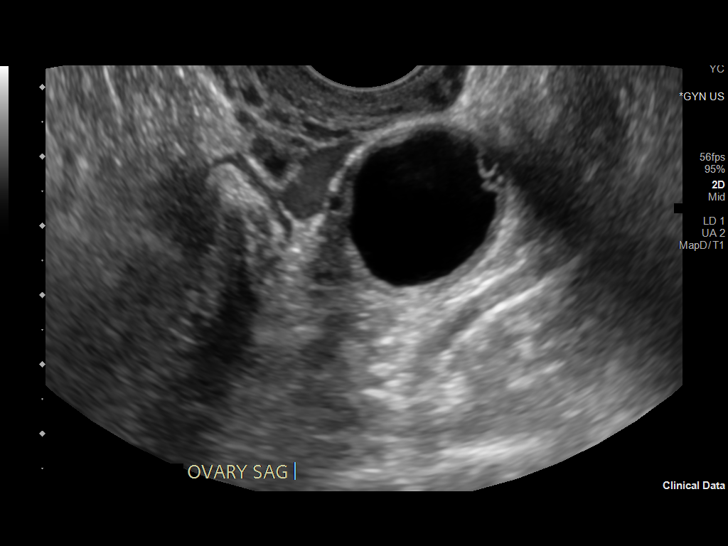
[im 65/78]
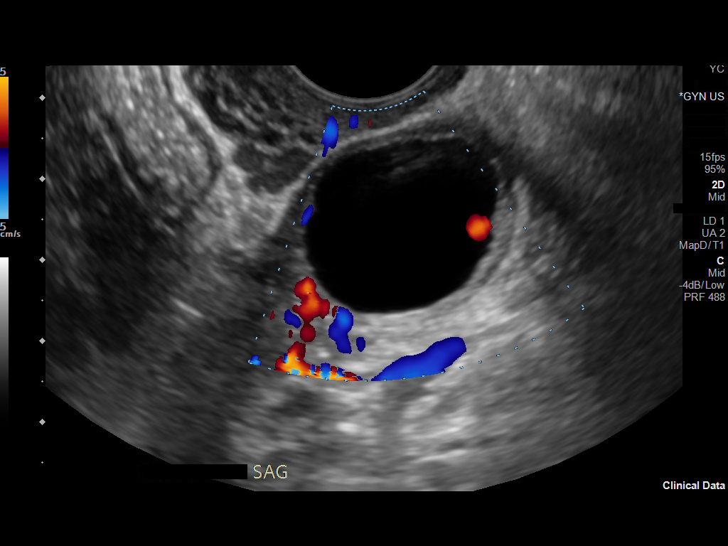
[im 71/78]
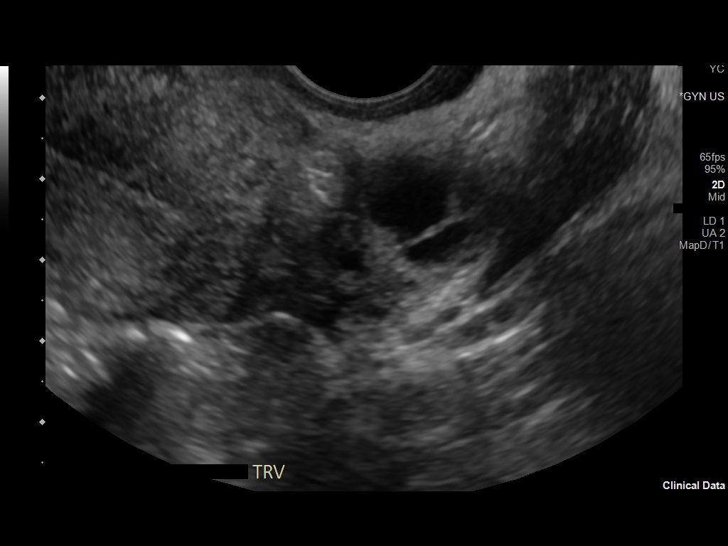
[im 78/78]
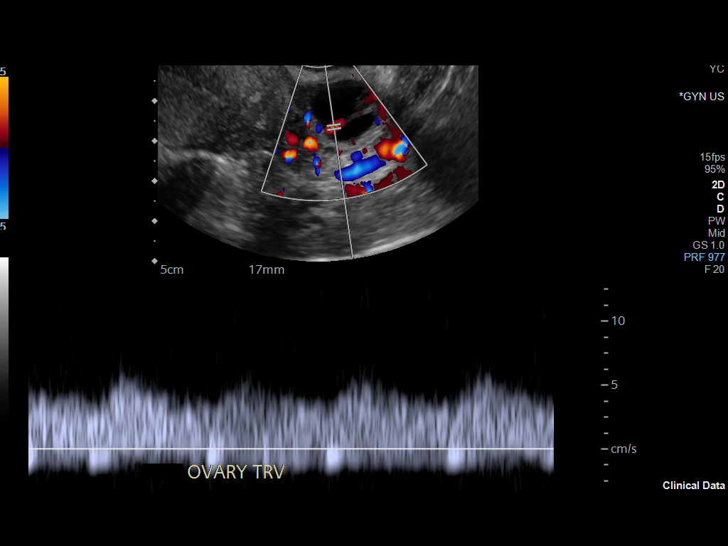

[13 of 25 positions shown; findings below may reference images not displayed]

FINDINGS: Uterus

Measurements: 7.0 x 5.2 x 3.8 cm = volume: 72 mL. No fibroids or
other mass visualized.

Endometrium

Thickness: 7.7 mm.  No focal abnormality visualized.

Right ovary

Measurements: 2.8 x 1.8 x 1.6 cm = volume: 4.2 mL. Normal
appearance/no adnexal mass.

Left ovary

Measurements: 3.6 x 2.8 x 2.5 cm = volume: 13.2 mL. 2.7 cm corpus
luteum cyst. Otherwise, unremarkable.

Pulsed Doppler evaluation of both ovaries demonstrates normal
low-resistance arterial and venous waveforms.

Other findings

No abnormal free fluid.
IMPRESSION: No significant abnormality. A 2.7 cm left ovarian corpus luteum cyst
is noted.

## 2020-10-05 IMAGING — US US ART/VEN ABD/PELV/SCROTUM DOPPLER LTD
1 series · 13 of 25 positions shown · non-contrast
Comparison: 07/01/2014

CLINICAL DATA: Pelvic pain.  Negative beta HCG.

EXAM:
TRANSABDOMINAL AND TRANSVAGINAL ULTRASOUND OF PELVIS
DOPPLER ULTRASOUND OF OVARIES
TECHNIQUE: Both transabdominal and transvaginal ultrasound examinations of the
pelvis were performed. Transabdominal technique was performed for
global imaging of the pelvis including uterus, ovaries, adnexal
regions, and pelvic cul-de-sac.
It was necessary to proceed with endovaginal exam following the
transabdominal exam to visualize the ovaries and better visualize
the uterus and endometrium. Color and duplex Doppler ultrasound was
utilized to evaluate blood flow to the ovaries.

[Series 1: us art/ven abd/pelv/scrotum doppler ltd · 78 acquisitions, 13 frames shown]
[im 1/78]
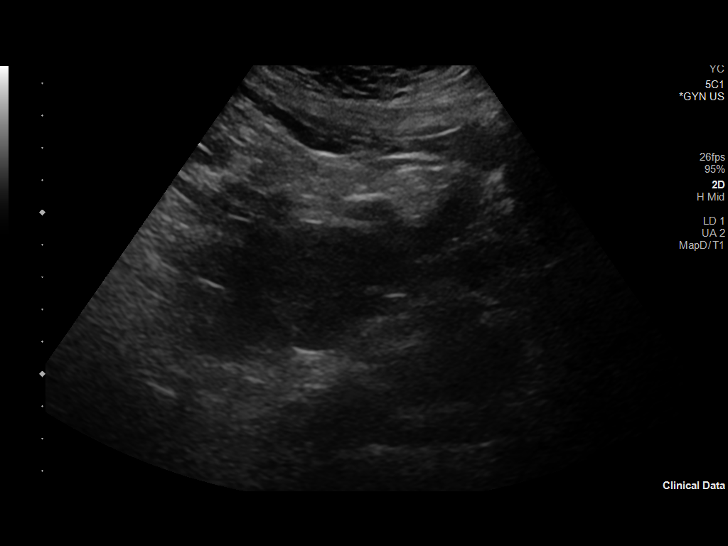
[im 7/78]
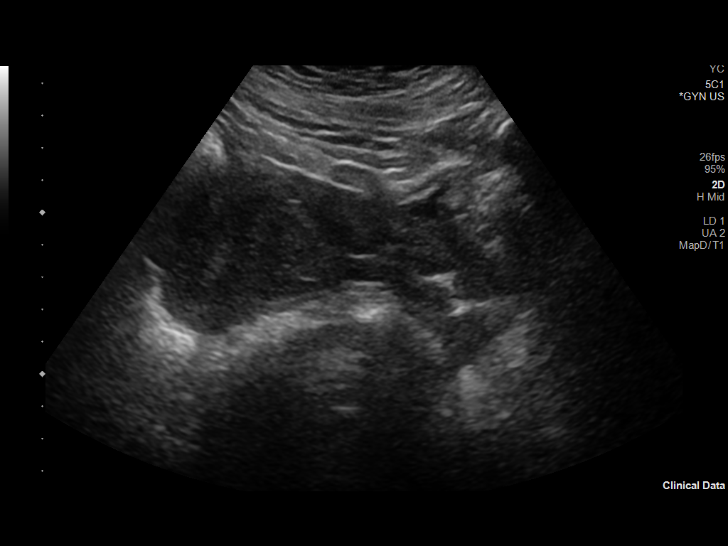
[im 13/78]
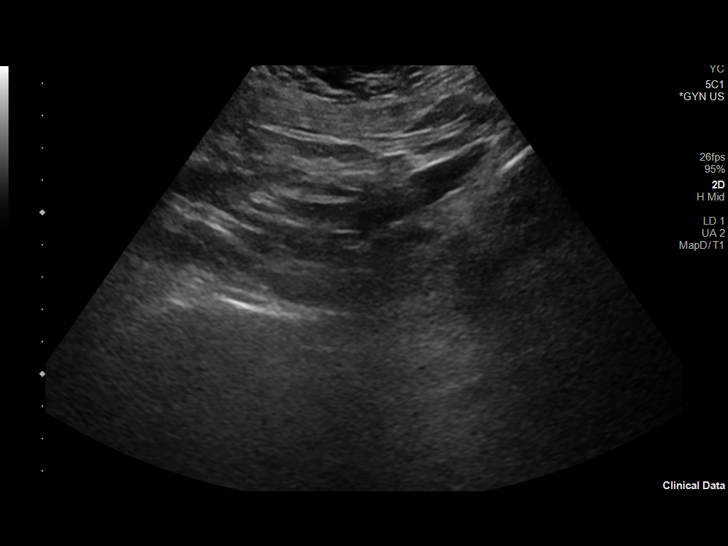
[im 20/78]
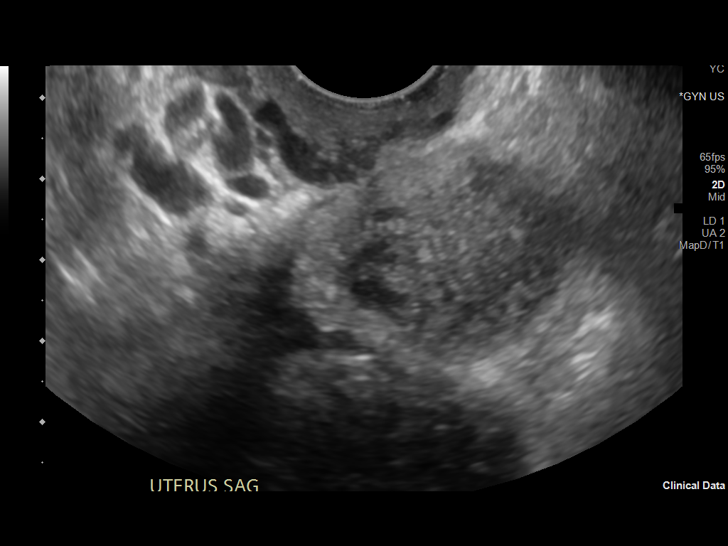
[im 26/78]
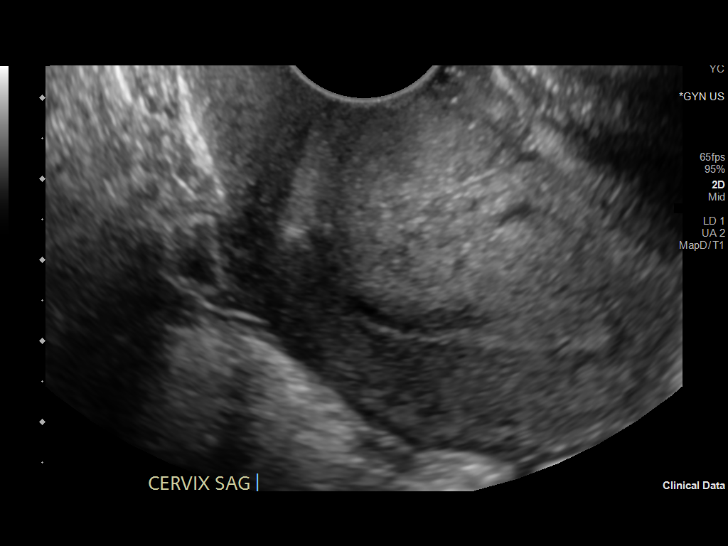
[im 33/78]
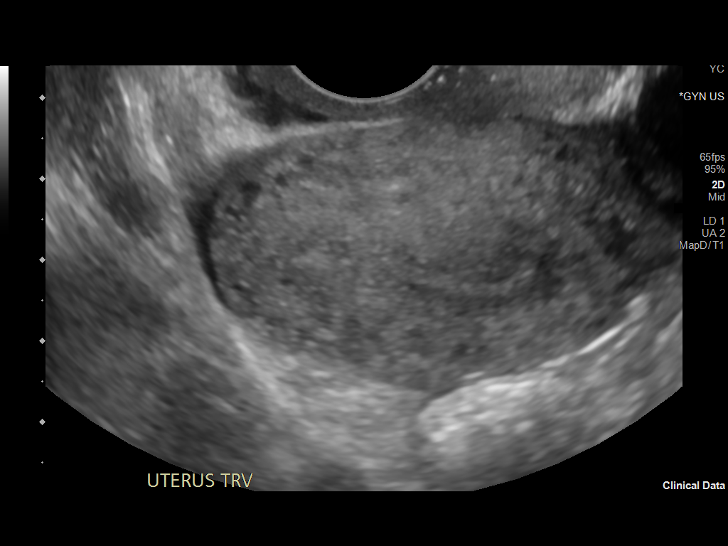
[im 39/78]
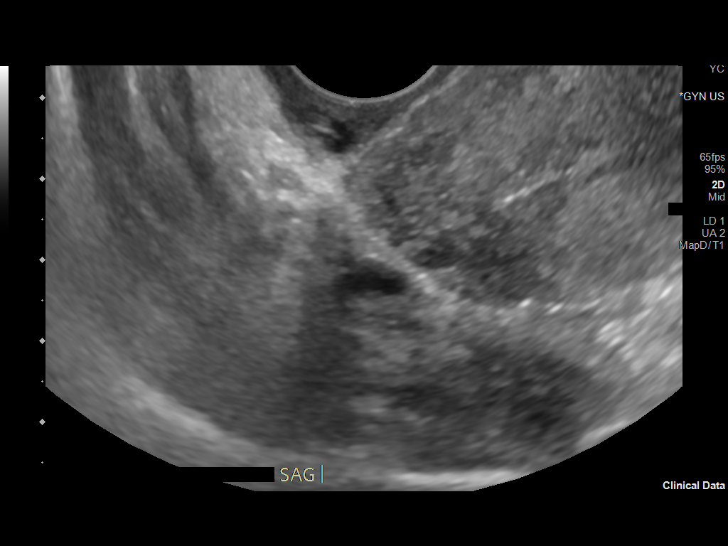
[im 45/78]
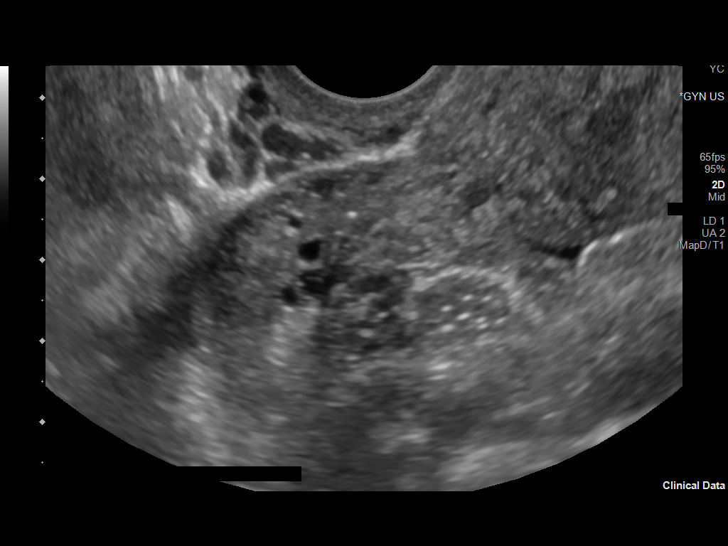
[im 52/78]
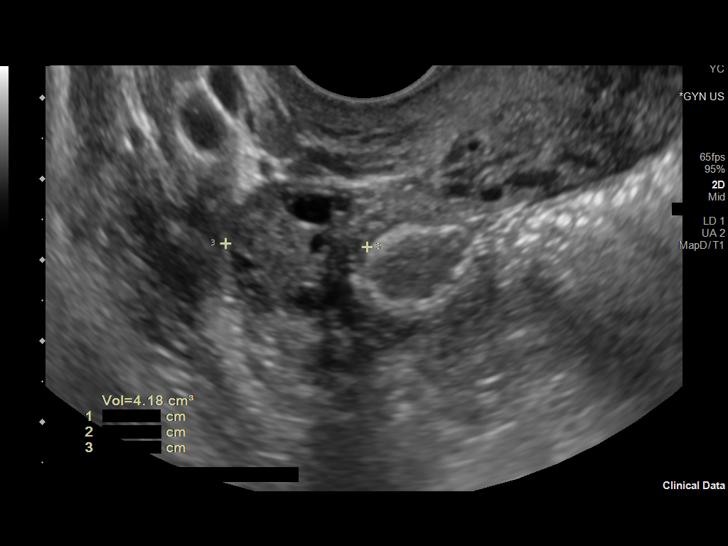
[im 58/78]
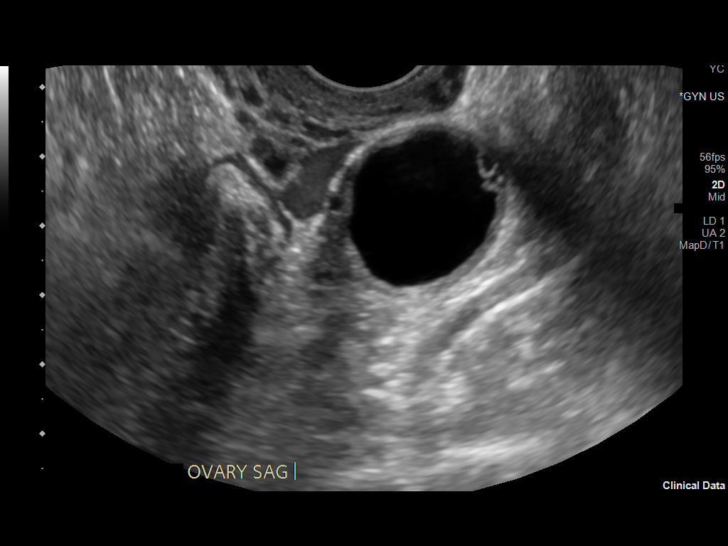
[im 65/78]
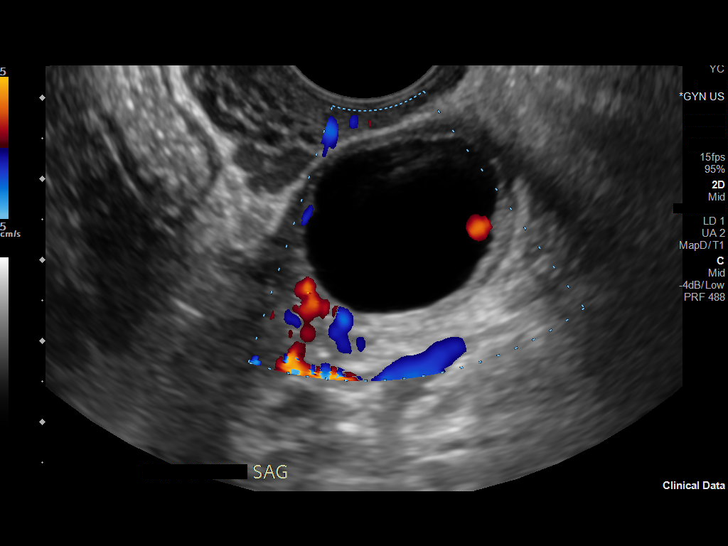
[im 71/78]
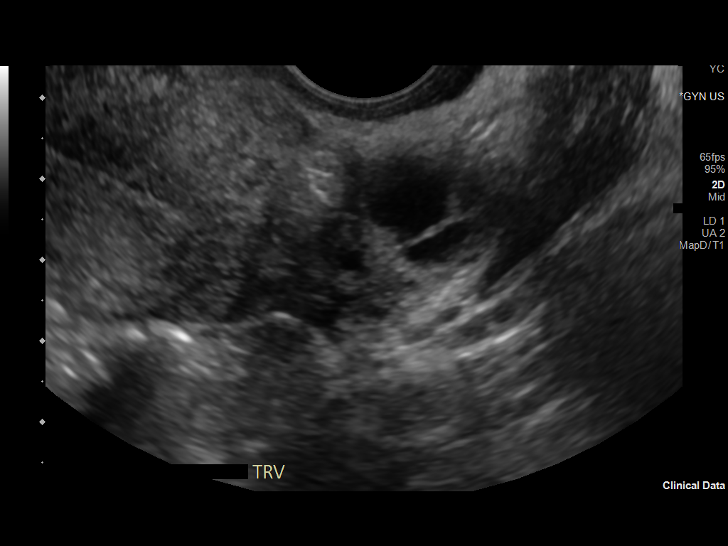
[im 78/78]
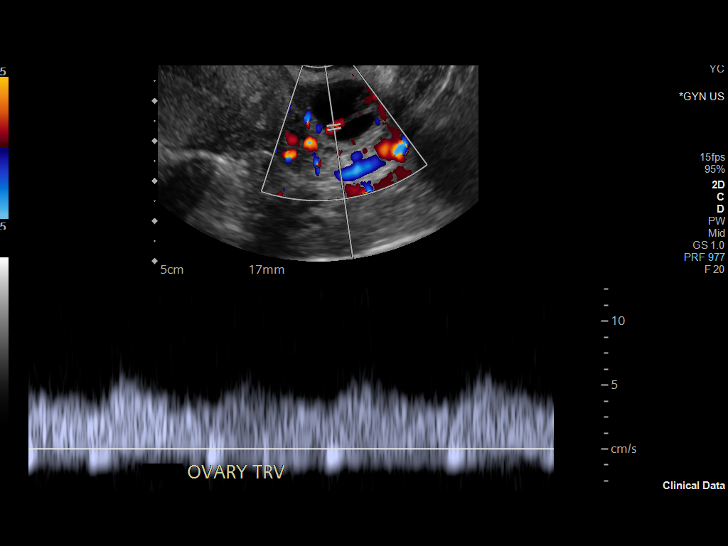

[13 of 25 positions shown; findings below may reference images not displayed]

FINDINGS: Uterus

Measurements: 7.0 x 5.2 x 3.8 cm = volume: 72 mL. No fibroids or
other mass visualized.

Endometrium

Thickness: 7.7 mm.  No focal abnormality visualized.

Right ovary

Measurements: 2.8 x 1.8 x 1.6 cm = volume: 4.2 mL. Normal
appearance/no adnexal mass.

Left ovary

Measurements: 3.6 x 2.8 x 2.5 cm = volume: 13.2 mL. 2.7 cm corpus
luteum cyst. Otherwise, unremarkable.

Pulsed Doppler evaluation of both ovaries demonstrates normal
low-resistance arterial and venous waveforms.

Other findings

No abnormal free fluid.
IMPRESSION: No significant abnormality. A 2.7 cm left ovarian corpus luteum cyst
is noted.

## 2020-11-20 ENCOUNTER — Other Ambulatory Visit: Payer: Self-pay

## 2020-11-20 ENCOUNTER — Encounter (HOSPITAL_COMMUNITY): Payer: Self-pay | Admitting: Family Medicine

## 2020-11-20 ENCOUNTER — Inpatient Hospital Stay (HOSPITAL_COMMUNITY)
Admission: AD | Admit: 2020-11-20 | Discharge: 2020-11-20 | Disposition: A | Discharge status: Home / Self Care | Payer: Self-pay | Attending: Family Medicine | Admitting: Family Medicine

## 2020-11-20 DIAGNOSIS — O479 False labor, unspecified: Secondary | ICD-10-CM

## 2020-11-20 DIAGNOSIS — Z3A38 38 weeks gestation of pregnancy: Secondary | ICD-10-CM | POA: Insufficient documentation

## 2020-11-20 DIAGNOSIS — O471 False labor at or after 37 completed weeks of gestation: Secondary | ICD-10-CM | POA: Insufficient documentation

## 2020-11-20 HISTORY — DX: Type 2 diabetes mellitus without complications: E11.9

## 2020-11-20 NOTE — MAU Note (Signed)
Ctxs since 2100. Some bloody show. Was 4cm this am at office. PNC is in [place], [place]. Denies any problems with pregnancy. No LOF

## 2020-11-20 NOTE — MAU Note (Signed)
Pt is a G3P2 at 38.4 week.  Pt is visiting from [facility].  This am she [facility] and she was 4cm.  Today she began to feel more contractions and lost ger mucus plug.  She came here for evaluation.  Pt is GDM diet controlled with no other complications reported.

## 2020-11-20 NOTE — MAU Provider Note (Signed)
Patient Vitals for the past 4 hrs:  BP Temp Pulse Resp Height Weight  11/20/20 2231 125/71 -- 67 -- -- --  11/20/20 2216 128/82 -- 67 -- -- --  11/20/20 2201 125/80 -- 75 -- -- --  11/20/20 2146 131/79 -- 63 -- -- --  11/20/20 2138 133/75 -- 65 -- -- --  11/20/20 2134 (!) 166/75 -- 72 -- -- --  11/20/20 2121 138/79 -- 73 -- -- --  11/20/20 2119 -- 98.3 F (36.8 C) -- 18 5' (1.524 m) 90.7 kg   (166/75 is not an accurate BP reading)_\   No change in cx for 2 hours, irregular and mild ctx.  NST: FHR baseline 140  bpm, Variability: moderate, Accelerations:present, Decelerations:  Absent= Cat 1/Reactive  Has IOL date on 12/16 if undelivered.  \ Labor precautions discussed.

## 2020-11-20 NOTE — Discharge Instructions (Signed)
[place]   Braxton Hicks Contractions Contractions of the uterus can occur throughout pregnancy, but they are not always a sign that you are in labor. You may have practice contractions called Braxton Hicks contractions. These false labor contractions are sometimes confused with true labor. What are Deberah Pelton contractions? Braxton Hicks contractions are tightening movements that occur in the muscles of the uterus before labor. Unlike true labor contractions, these contractions do not result in opening (dilation) and thinning of the cervix. Toward the end of pregnancy (32-34 weeks), Braxton Hicks contractions can happen more often and may become stronger. These contractions are sometimes difficult to tell apart from true labor because they can be very uncomfortable. You should not feel embarrassed if you go to the hospital with false labor. Sometimes, the only way to tell if you are in true labor is for your health care provider to look for changes in the cervix. The health care provider will do a physical exam and may monitor your contractions. If you are not in true labor, the exam should show that your cervix is not dilating and your water has not broken. If there are no other health problems associated with your pregnancy, it is completely safe for you to be sent home with false labor. You may continue to have Braxton Hicks contractions until you go into true labor. How to tell the difference between true labor and false labor True labor  Contractions last 30-70 seconds.  Contractions become very regular.  Discomfort is usually felt in the top of the uterus, and it spreads to the lower abdomen and low back.  Contractions do not go away with walking.  Contractions usually become more intense and increase in frequency.  The cervix dilates and gets thinner. False labor  Contractions are usually shorter and not as strong as true labor  contractions.  Contractions are usually irregular.  Contractions are often felt in the front of the lower abdomen and in the groin.  Contractions may go away when you walk around or change positions while lying down.  Contractions get weaker and are shorter-lasting as time goes on.  The cervix usually does not dilate or become thin. Follow these instructions at home:   Take over-the-counter and prescription medicines only as told by your health care provider.  Keep up with your usual exercises and follow other instructions from your health care provider.  Eat and drink lightly if you think you are going into labor.  If Braxton Hicks contractions are making you uncomfortable: ? Change your position from lying down or resting to walking, or change from walking to resting. ? Sit and rest in a tub of warm water. ? Drink enough fluid to keep your urine pale yellow. Dehydration may cause these contractions. ? Do slow and deep breathing several times an hour.  Keep all follow-up prenatal visits as told by your health care provider. This is important. Contact a health care provider if:  You have a fever.  You have continuous pain in your abdomen. Get help right away if:  Your contractions become stronger, more regular, and closer together.  You have fluid leaking or gushing from your vagina.  You pass blood-tinged mucus (bloody show).  You have bleeding from your vagina.  You have low back pain that you never had before.  You feel your baby's head pushing down and causing pelvic pressure.  Your baby is not moving inside you as much as it used to. Summary  Contractions that occur before labor are called Braxton Hicks contractions, false labor, or practice contractions.  Braxton Hicks contractions are usually shorter, weaker, farther apart, and less regular than true labor contractions. True labor contractions usually become progressively stronger and regular, and they become  more frequent.  Manage discomfort from [facility] contractions by changing position, resting in a warm bath, drinking plenty of water, or practicing deep breathing. This information is not intended to replace advice given to you by your health care provider. Make sure you discuss any questions you have with your health care provider. Document Revised: 11/11/2017 Document Reviewed: 04/14/2017 Elsevier Patient Education  2020 [employer].
